# Patient Record
Sex: Male | Born: 1970
Health system: Southern US, Community
[De-identification: ages and names within clinical notes are randomized; demographics above are authoritative.]

## PROBLEM LIST (undated history)

## (undated) DIAGNOSIS — E785 Hyperlipidemia, unspecified: Secondary | ICD-10-CM

## (undated) DIAGNOSIS — G5603 Carpal tunnel syndrome, bilateral upper limbs: Secondary | ICD-10-CM

## (undated) DIAGNOSIS — R7989 Other specified abnormal findings of blood chemistry: Secondary | ICD-10-CM

## (undated) DIAGNOSIS — J45909 Unspecified asthma, uncomplicated: Secondary | ICD-10-CM

## (undated) DIAGNOSIS — M069 Rheumatoid arthritis, unspecified: Secondary | ICD-10-CM

## (undated) DIAGNOSIS — R945 Abnormal results of liver function studies: Secondary | ICD-10-CM

## (undated) DIAGNOSIS — G4730 Sleep apnea, unspecified: Secondary | ICD-10-CM

## (undated) HISTORY — DX: Abnormal results of liver function studies: R94.5

## (undated) HISTORY — DX: Other specified abnormal findings of blood chemistry: R79.89

## (undated) HISTORY — DX: Hyperlipidemia, unspecified: E78.5

## (undated) HISTORY — PX: WISDOM TOOTH EXTRACTION: SHX21

## (undated) HISTORY — DX: Rheumatoid arthritis, unspecified: M06.9

---

## 2013-03-09 ENCOUNTER — Emergency Department
Admission: EM | Admit: 2013-03-09 | Discharge: 2013-03-09 | Disposition: A | Discharge status: Home / Self Care | Payer: BC Managed Care – PPO | Source: Home / Self Care | Attending: Family Medicine | Admitting: Family Medicine

## 2013-03-09 ENCOUNTER — Telehealth: Payer: Self-pay | Admitting: Family Medicine

## 2013-03-09 ENCOUNTER — Encounter: Payer: Self-pay | Admitting: *Deleted

## 2013-03-09 DIAGNOSIS — W57XXXA Bitten or stung by nonvenomous insect and other nonvenomous arthropods, initial encounter: Secondary | ICD-10-CM

## 2013-03-09 DIAGNOSIS — R21 Rash and other nonspecific skin eruption: Secondary | ICD-10-CM

## 2013-03-09 LAB — COMPREHENSIVE METABOLIC PANEL
ALT: 46 U/L (ref 0–53)
AST: 33 U/L (ref 0–37)
Albumin: 4.4 g/dL (ref 3.5–5.2)
Alkaline Phosphatase: 60 U/L (ref 39–117)
BUN: 10 mg/dL (ref 6–23)
CO2: 27 mEq/L (ref 19–32)
Calcium: 10 mg/dL (ref 8.4–10.5)
Chloride: 102 mEq/L (ref 96–112)
Creat: 0.9 mg/dL (ref 0.50–1.35)
Glucose, Bld: 80 mg/dL (ref 70–99)
Potassium: 4.6 mEq/L (ref 3.5–5.3)
Sodium: 140 mEq/L (ref 135–145)
Total Bilirubin: 0.7 mg/dL (ref 0.3–1.2)
Total Protein: 7.7 g/dL (ref 6.0–8.3)

## 2013-03-09 LAB — POCT CBC W AUTO DIFF (K'VILLE URGENT CARE)

## 2013-03-09 LAB — SEDIMENTATION RATE: Sed Rate: 10 mm/hr (ref 0–16)

## 2013-03-09 MED ORDER — PREDNISONE 50 MG PO TABS: ORAL_TABLET | ORAL | Status: DC

## 2013-03-09 NOTE — ED Notes (Signed)
Patient c/o pain and rash on hip and back x 2 days; states he went through a spider web on Thursday night; than started to develop rash. Has tried OTC Benadryl and Motrin with no relief.

## 2013-03-09 NOTE — ED Provider Notes (Signed)
CSN: [id number]     Arrival date & time 03/09/13  1219 History   First MD Initiated Contact with Patient 03/09/13 1232     Chief Complaint  Patient presents with  . Rash    HPI  HPI  This patient complains of a RASH  Location: L lateral lower side   Onset: 2-3 days   Course: Pt states that he was outside at midnight and walked through a very large spider web. Spider web covered entire upper torso. No known spider bite at the time. Pt took a shower. Woke up the next morning with lateral side pain and mild rash. Pain was intermittent. Woke up next am. Had mild back intermittent back pain and rash. No fevers, chills. No known tick exposures. No burning or tingling. Self-treated with: nothing   Improvement with treatment: n/a  History  Itching: no  Tenderness: mild  New medications/antibiotics: no  Pet exposure: no  Recent travel or tropical exposure: no  New soaps, shampoos, detergent, clothing: no  Tick/insect exposure: yes; large spider web  Chemical Exposure: no  Red Flags  Feeling ill: no  Fever: no  Facial/tongue swelling/difficulty breathing: no  Diabetic or immunocompromised: no    History reviewed. No pertinent past medical history. History reviewed. No pertinent past surgical history. History reviewed. No pertinent family history. History  Substance Use Topics  . Smoking status: Never Smoker   . Smokeless tobacco: Not on file  . Alcohol Use: No    Review of Systems  All other systems reviewed and are negative.    Allergies  Review of patient's allergies indicates not on file.  Home Medications  No current outpatient prescriptions on file. BP 125/88  Pulse 74  Temp(Src) 98 F (36.7 C) (Oral)  Resp 16  Ht 6' (1.829 m)  Wt 215 lb 8 oz (97.75 kg)  BMI 29.22 kg/m2  SpO2 98% Physical Exam  Constitutional: He appears well-developed and well-nourished.  HENT:  Head: Normocephalic and atraumatic.  Eyes: Conjunctivae are normal. Pupils are equal, round,  and reactive to light.  Neck: Normal range of motion.  Cardiovascular: Normal rate and regular rhythm.   Pulmonary/Chest: Effort normal and breath sounds normal.  Abdominal: Soft.  Musculoskeletal: Normal range of motion.  Neurological: He is alert.  Skin: Rash noted.     ED Course  Procedures (including critical care time) Labs Review Labs Reviewed  ROCKY MTN SPOTTED FVR AB, IGG-BLOOD  ROCKY MTN SPOTTED FVR AB, IGM-BLOOD  B. BURGDORFI ANTIBODIES  SEDIMENTATION RATE  COMPREHENSIVE METABOLIC PANEL  POCT CBC W AUTO DIFF ([facility])   Imaging Review No results found.  MDM   1. Rash and nonspecific skin eruption   2. Insect bite    DDx for rash is fairly broad include insect (spider) bite, zoster, hsv, RMSF. No red flags on exam.Had a relatively lengthy discussion with pt. Overall well appearing. Would like watchful waiting approach. Will obtain baseline labs including RMSF titiers, lyme, HSV, VZV, CMET. Will give rx for prednisone if sxs worsen. Discussed derm red flags including fever, worsening pain.  Follow up as needed.     The patient and/or caregiver has been counseled thoroughly with regard to treatment plan and/or medications prescribed including dosage, schedule, interactions, rationale for use, and possible side effects and they verbalize understanding. Diagnoses and expected course of recovery discussed and will return if not improved as expected or if the condition worsens. Patient and/or caregiver verbalized understanding.  Doree Albee, MD 03/09/13 1327

## 2013-03-11 LAB — HSV(HERPES SIMPLEX VRS) I + II AB-IGG
HSV 1 Glycoprotein G Ab, IgG: 7.5 IV — ABNORMAL HIGH
HSV 2 Glycoprotein G Ab, IgG: <0.1 IV

## 2013-03-11 LAB — ROCKY MTN SPOTTED FVR AB, IGM-BLOOD: ROCKY MTN SPOTTED FEVER, IGM: 0.11 IV

## 2013-03-11 LAB — B. BURGDORFI ANTIBODIES: B burgdorferi Ab IgG+IgM: 0.33 {ISR}

## 2013-03-11 LAB — ROCKY MTN SPOTTED FVR AB, IGG-BLOOD: RMSF IgG: 0.2 IV

## 2013-03-11 LAB — HSV(HERPES SIMPLEX VRS) I + II AB-IGM: Herpes Simplex Vrs I&II-IgM Ab (EIA): 0.52 INDEX

## 2013-03-12 LAB — VARICELLA ZOSTER ANTIBODY, IGG: Varicella IgG: 1097 Index — ABNORMAL HIGH (ref <135.00)

## 2013-03-12 LAB — VARICELLA ZOSTER ANTIBODY, IGM: Varicella Zoster Ab IgM: 0.04 {ISR} (ref <0.91)

## 2013-03-13 ENCOUNTER — Telehealth: Payer: Self-pay | Admitting: *Deleted

## 2013-03-14 ENCOUNTER — Emergency Department: Admission: EM | Admit: 2013-03-14 | Discharge: 2013-03-14 | Discharge status: Absent without leave | Payer: Self-pay

## 2014-04-27 ENCOUNTER — Emergency Department
Admission: EM | Admit: 2014-04-27 | Discharge: 2014-04-27 | Disposition: A | Discharge status: Home / Self Care | Payer: 59 | Source: Home / Self Care | Attending: Emergency Medicine | Admitting: Emergency Medicine

## 2014-04-27 ENCOUNTER — Encounter: Payer: Self-pay | Admitting: Emergency Medicine

## 2014-04-27 DIAGNOSIS — J0100 Acute maxillary sinusitis, unspecified: Secondary | ICD-10-CM | POA: Diagnosis not present

## 2014-04-27 DIAGNOSIS — H65112 Acute and subacute allergic otitis media (mucoid) (sanguinous) (serous), left ear: Secondary | ICD-10-CM | POA: Diagnosis not present

## 2014-04-27 MED ORDER — AZITHROMYCIN 250 MG PO TABS
ORAL_TABLET | ORAL | Status: DC
Start: 2014-04-27 — End: 2014-06-18

## 2014-04-27 NOTE — ED Notes (Signed)
Reports one week of congestion, sinus discomfort, ear pain primarily in left, fatigue, cough, general aches; denies fever. No OTCs except Sudafed today.

## 2014-04-27 NOTE — ED Provider Notes (Signed)
CSN: [id number]     Arrival date & time 04/27/14  1424 History   First MD Initiated Contact with Patient 04/27/14 1443     Chief Complaint  Patient presents with  . Facial Pain  . Otalgia  . Nasal Congestion  . Cough  . Sore Throat  . Fatigue    HPI Reports one week of nasal and sinuscongestion, sinus discomfort,discolored rhinorrhea, L ear pain , fatigue, occasional nonproductive cough withgeneral aches; No OTCs except Sudafed today.that didn't seem to help significantly. No chest pain or shortness of breath. No nausea or vomiting. Has had mild fever and chills. Occasional myalgias. Minimal sore throat History reviewed. No pertinent past medical history. History reviewed. No pertinent past surgical history. Family History  Problem Relation Age of Onset  . Hypertension Mother   . Hypertension Father   . Hypertension Sister   . Hypertension Brother    History  Substance Use Topics  . Smoking status: Never Smoker   . Smokeless tobacco: Not on file  . Alcohol Use: No    Review of Systems  All other systems reviewed and are negative.   Allergies  Doxycycline  Home Medications   Prior to Admission medications   Medication Sig Start Date End Date Taking? Authorizing Provider  phenylephrine (SUDAFED PE) 10 MG TABS tablet Take 10 mg by mouth every 6 (six) hours as needed.   Yes Historical Provider, MD  azithromycin (ZITHROMAX Z-PAK) 250 MG tablet Take 2 tablets on day one, then 1 tablet daily on days 2 through 5 04/27/14   Jacqulyn Cane, MD  predniSONE (DELTASONE) 50 MG tablet 1 tab daily x 5 days 03/09/13   Shanda Howells, MD   BP 129/84 mmHg  Pulse 80  Temp(Src) 98 F (36.7 C) (Oral)  Resp 16  Ht 6' (1.829 m)  Wt 205 lb (92.987 kg)  BMI 27.80 kg/m2  SpO2 97% Physical Exam  Constitutional: He is oriented to person, place, and time. He appears well-developed and well-nourished. No distress.  HENT:  Head: Normocephalic and atraumatic.  Right Ear: Tympanic membrane,  external ear and ear canal normal. No drainage, swelling or tenderness. No mastoid tenderness. No decreased hearing is noted.  Left Ear: External ear and ear canal normal. No drainage, swelling or tenderness. No mastoid tenderness. Tympanic membrane is erythematous. Tympanic membrane is not perforated, not retracted and not bulging. A middle ear effusion (mild) is present. No decreased hearing is noted.  Nose: Mucosal edema and rhinorrhea present. Right sinus exhibits maxillary sinus tenderness. Left sinus exhibits maxillary sinus tenderness.  Mouth/Throat: Oropharynx is clear and moist. No oral lesions. No oropharyngeal exudate.  Eyes: Right eye exhibits no discharge. Left eye exhibits no discharge. No scleral icterus.  Neck: Neck supple.  Cardiovascular: Normal rate, regular rhythm and normal heart sounds.   Pulmonary/Chest: Effort normal and breath sounds normal. He has no wheezes. He has no rales.  Lymphadenopathy:    He has no cervical adenopathy.  Neurological: He is alert and oriented to person, place, and time.  Skin: Skin is warm and dry.  Nursing note and vitals reviewed.   ED Course  Procedures (including critical care time) Labs Review Labs Reviewed - No data to display  Imaging Review No results found.   MDM   1. Acute maxillary sinusitis, recurrence not specified   2. Acute mucoid otitis media of left ear    Treatment options discussed, as well as risks, benefits, alternatives. Antibiotic options discussed, such as Augmentin or a cephalosporin,  but he states he prefers Zithromax as he has done well with that in the past. Patient voiced understanding and agreement with the following plans:  Discharge Medication List as of 04/27/2014  3:56 PM    START taking these medications   Details  azithromycin (ZITHROMAX Z-PAK) 250 MG tablet Take 2 tablets on day one, then 1 tablet daily on days 2 through 5, Normal       Continue Flonase,decongestant, and other symptomatic  care. Over 25 minutes spent, greater than 50% of the time spent for counseling and coordination of care.--questions invited and answered. Follow-up with your primary care doctor in 5-7 days if not improving, or sooner if symptoms become worse. Precautions discussed. Red flags discussed. Questions invited and answered. Patient voiced understanding and agreement.    Jacqulyn Cane, MD 04/27/14 2004

## 2014-06-18 ENCOUNTER — Emergency Department
Admission: EM | Admit: 2014-06-18 | Discharge: 2014-06-18 | Disposition: A | Discharge status: Home / Self Care | Payer: 59 | Source: Home / Self Care | Attending: Family Medicine | Admitting: Family Medicine

## 2014-06-18 ENCOUNTER — Encounter: Payer: Self-pay | Admitting: *Deleted

## 2014-06-18 DIAGNOSIS — J029 Acute pharyngitis, unspecified: Secondary | ICD-10-CM

## 2014-06-18 LAB — POCT RAPID STREP A (OFFICE): Rapid Strep A Screen: NEGATIVE

## 2014-06-18 MED ORDER — AZITHROMYCIN 250 MG PO TABS: ORAL_TABLET | ORAL | Status: DC

## 2014-06-18 MED ORDER — BENZONATATE 200 MG PO CAPS: 200.0000 mg | ORAL_CAPSULE | Freq: Every day | ORAL | Status: DC

## 2014-06-18 NOTE — ED Provider Notes (Signed)
CSN: [id number]     Arrival date & time 06/18/14  0102 History   First MD Initiated Contact with Patient 06/18/14 1017     Chief Complaint  Patient presents with  . Sore Throat      HPI Comments: Patient complains of three day history of cold-like symptoms including mild sore throat, hoarseness,  fatigue, and cough.  He denies fever and sinus congestion.  The history is provided by the patient.    History reviewed. No pertinent past medical history. History reviewed. No pertinent past surgical history. Family History  Problem Relation Age of Onset  . Hypertension Mother   . Hypertension Father   . Hypertension Sister   . Hypertension Brother    History  Substance Use Topics  . Smoking status: Never Smoker   . Smokeless tobacco: Never Used  . Alcohol Use: No    Review of Systems + sore throat + hoarse + cough No pleuritic pain No wheezing + nasal congestion + post-nasal drainage No sinus pain/pressure No itchy/red eyes No earache No hemoptysis No SOB No fever/chills No nausea No vomiting No abdominal pain No diarrhea No urinary symptoms No skin rash + fatigue No myalgias No headache Used OTC meds without relief  Allergies  Doxycycline  Home Medications   Prior to Admission medications   Medication Sig Start Date End Date Taking? Authorizing Provider  benzonatate (TESSALON) 200 MG capsule Take 1 capsule (200 mg total) by mouth at bedtime. Take as needed for cough 06/18/14   Kandra Nicolas, MD   BP 144/97 mmHg  Pulse 73  Temp(Src) 97.9 F (36.6 C) (Oral)  Resp 14  Wt 216 lb (97.977 kg)  SpO2 98% Physical Exam Nursing notes and Vital Signs reviewed. Appearance:  Patient appears stated age, and in no acute distress Eyes:  Pupils are equal, round, and reactive to light and accomodation.  Extraocular movement is intact.  Conjunctivae are not inflamed  Ears:  Canals normal.  Tympanic membranes normal.  Nose:  Mildly congested turbinates.  No sinus  tenderness.   Pharynx:  Mild erythema of posterior pharynx and uvula. Neck:  Supple.  Tender enlarged posterior nodes are palpated bilaterally  Lungs:  Clear to auscultation.  Breath sounds are equal.  Heart:  Regular rate and rhythm without murmurs, rubs, or gallops.  Abdomen:  Nontender without masses or hepatosplenomegaly.  Bowel sounds are present.  No CVA or flank tenderness.  Extremities:  No edema.  No calf tenderness Skin:  No rash present.   ED Course  Procedures    None  Labs Reviewed  STREP A DNA PROBE  POCT RAPID STREP A (OFFICE) negative      MDM   1. Acute pharyngitis, unspecified pharyngitis type; suspect early viral URI    There is no evidence of bacterial infection today.  Throat culture pending. Prescription written for Benzonatate [facility]) to take at bedtime for night-time cough.   If cold-like symptoms develop, begin the following: Take plain Mucinex (1200 mg guaifenesin) twice daily for cough and congestion.  May add Sudafed for sinus congestion.   Increase fluid intake, rest. May use Afrin nasal spray (or generic oxymetazoline) twice daily for about 5 days.  Also recommend using saline nasal spray several times daily and saline nasal irrigation (AYR is a common brand) Try warm salt water gargles for sore throat.  Stop all antihistamines for now, and other non-prescription cough/cold preparations. May take Ibuprofen 200mg , 4 tabs every 8 hours with food for sore throat.  Begin Tessalon for night cough. Begin Azithromycin if not improving about one week or if persistent fever develops (Given a prescription to hold, with an expiration date)  Follow-up with family doctor if not improving about10 days.     Kandra Nicolas, MD 06/20/14 [phone]

## 2014-06-18 NOTE — Discharge Instructions (Signed)
If cold-like symptoms develop, begin the following: Take plain Mucinex (1200 mg guaifenesin) twice daily for cough and congestion.  May add Sudafed for sinus congestion.   Increase fluid intake, rest. May use Afrin nasal spray (or generic oxymetazoline) twice daily for about 5 days.  Also recommend using saline nasal spray several times daily and saline nasal irrigation (AYR is a common brand) Try warm salt water gargles for sore throat.  Stop all antihistamines for now, and other non-prescription cough/cold preparations. May take Ibuprofen 200mg , 4 tabs every 8 hours with food for sore throat. Begin Tessalon for night cough. Begin Azithromycin if not improving about one week or if persistent fever develops  Follow-up with family doctor if not improving about10 days.

## 2014-06-18 NOTE — ED Notes (Signed)
Andre Cohen c/o sore throat x 3 days. Several days ago he tried swallowing while sneezing and was left feeling "punched in the throat". Throat was sore for several days but resolved. Returned 3 days ago.

## 2014-06-19 LAB — STREP A DNA PROBE: GASP: NEGATIVE

## 2014-08-18 ENCOUNTER — Ambulatory Visit: Payer: 59 | Admitting: Sports Medicine

## 2014-08-20 ENCOUNTER — Encounter: Payer: Self-pay | Admitting: Sports Medicine

## 2014-08-20 ENCOUNTER — Ambulatory Visit (INDEPENDENT_AMBULATORY_CARE_PROVIDER_SITE_OTHER): Payer: 59 | Admitting: Sports Medicine

## 2014-08-20 ENCOUNTER — Ambulatory Visit (INDEPENDENT_AMBULATORY_CARE_PROVIDER_SITE_OTHER): Payer: 59

## 2014-08-20 VITALS — BP 130/93 | HR 82 | Ht 72.0 in | Wt 221.0 lb

## 2014-08-20 DIAGNOSIS — R7303 Prediabetes: Secondary | ICD-10-CM

## 2014-08-20 DIAGNOSIS — K7581 Nonalcoholic steatohepatitis (NASH): Secondary | ICD-10-CM

## 2014-08-20 DIAGNOSIS — E875 Hyperkalemia: Secondary | ICD-10-CM

## 2014-08-20 DIAGNOSIS — B001 Herpesviral vesicular dermatitis: Secondary | ICD-10-CM

## 2014-08-20 DIAGNOSIS — R7309 Other abnormal glucose: Secondary | ICD-10-CM

## 2014-08-20 DIAGNOSIS — E785 Hyperlipidemia, unspecified: Secondary | ICD-10-CM

## 2014-08-20 DIAGNOSIS — Z0000 Encounter for general adult medical examination without abnormal findings: Secondary | ICD-10-CM

## 2014-08-20 LAB — CBC
HCT: 45 % (ref 39.0–52.0)
Hemoglobin: 15.5 g/dL (ref 13.0–17.0)
MCH: 30.1 pg (ref 26.0–34.0)
MCHC: 34.4 g/dL (ref 30.0–36.0)
MCV: 87.4 fL (ref 78.0–100.0)
MPV: 11.1 fL (ref 8.6–12.4)
Platelets: 354 10*3/uL (ref 150–400)
RBC: 5.15 MIL/uL (ref 4.22–5.81)
RDW: 13.6 % (ref 11.5–15.5)
WBC: 6.3 10*3/uL (ref 4.0–10.5)

## 2014-08-20 LAB — TSH: TSH: 1.511 u[IU]/mL (ref 0.350–4.500)

## 2014-08-20 LAB — HEMOGLOBIN A1C
Hgb A1c MFr Bld: 6 % — ABNORMAL HIGH (ref <5.7)
Mean Plasma Glucose: 126 mg/dL — ABNORMAL HIGH (ref <117)

## 2014-08-20 MED ORDER — VALACYCLOVIR HCL 1 G PO TABS: 1000.0000 mg | ORAL_TABLET | Freq: Two times a day (BID) | ORAL | Status: DC

## 2014-08-20 NOTE — Assessment & Plan Note (Signed)
Healthy male, checking routine blood work.

## 2014-08-20 NOTE — Assessment & Plan Note (Signed)
Valtrex for use as needed

## 2014-08-20 NOTE — Progress Notes (Signed)
  Subjective:    CC: Establish care.   HPI:  This is a very pleasant healthy 44 year old male, he gets occasional cold sores and would like some Valtrex.  He also has a history of fatty liver with mildly elevated liver function tests, this has been evaluated and worked up by hepatology and he agrees to simply do yearly liver function and ultrasounds.  Past medical history, Surgical history, Family history not pertinant except as noted below, Social history, Allergies, and medications have been entered into the medical record, reviewed, and no changes needed.   Review of Systems: No headache, visual changes, nausea, vomiting, diarrhea, constipation, dizziness, abdominal pain, skin rash, fevers, chills, night sweats, swollen lymph nodes, weight loss, chest pain, body aches, joint swelling, muscle aches, shortness of breath, mood changes, visual or auditory hallucinations.  Objective:    General: Well Developed, well nourished, and in no acute distress.  Neuro: Alert and oriented x3, extra-ocular muscles intact, sensation grossly intact. Cranial nerves II through XII are intact, motor, sensory, and coordinative functions are all intact. HEENT: Normocephalic, atraumatic, pupils equal round reactive to light, neck supple, no masses, no lymphadenopathy, thyroid nonpalpable. Oropharynx, nasopharynx, external ear canals are unremarkable. Skin: Warm and dry, no rashes noted.  Cardiac: Regular rate and rhythm, no murmurs rubs or gallops.  Respiratory: Clear to auscultation bilaterally. Not using accessory muscles, speaking in full sentences.  Abdominal: Soft, nontender, nondistended, positive bowel sounds, no masses, no organomegaly.  Musculoskeletal: Shoulder, elbow, wrist, hip, knee, ankle stable, and with full range of motion.  Impression and Recommendations:    The patient was counselled, risk factors were discussed, anticipatory guidance given.

## 2014-08-20 NOTE — Assessment & Plan Note (Signed)
This is been worked up by hepatology. We will simply screen him with yearly liver function and an ultrasound.

## 2014-08-21 DIAGNOSIS — R7303 Prediabetes: Secondary | ICD-10-CM | POA: Insufficient documentation

## 2014-08-21 DIAGNOSIS — E785 Hyperlipidemia, unspecified: Secondary | ICD-10-CM | POA: Insufficient documentation

## 2014-08-21 DIAGNOSIS — E875 Hyperkalemia: Secondary | ICD-10-CM | POA: Insufficient documentation

## 2014-08-21 LAB — COMPREHENSIVE METABOLIC PANEL
ALT: 49 U/L (ref 0–53)
AST: 37 U/L (ref 0–37)
Albumin: 4.6 g/dL (ref 3.5–5.2)
Alkaline Phosphatase: 53 U/L (ref 39–117)
BUN: 16 mg/dL (ref 6–23)
CO2: 23 mEq/L (ref 19–32)
Calcium: 10.1 mg/dL (ref 8.4–10.5)
Chloride: 103 mEq/L (ref 96–112)
Creat: 0.92 mg/dL (ref 0.50–1.35)
Glucose, Bld: 90 mg/dL (ref 70–99)
Potassium: 5.5 mEq/L — ABNORMAL HIGH (ref 3.5–5.3)
Sodium: 140 mEq/L (ref 135–145)
Total Bilirubin: 0.5 mg/dL (ref 0.2–1.2)
Total Protein: 7.5 g/dL (ref 6.0–8.3)

## 2014-08-21 LAB — LIPID PANEL
Cholesterol: 208 mg/dL — ABNORMAL HIGH (ref 0–200)
HDL: 43 mg/dL (ref >=40)
LDL Cholesterol: 148 mg/dL — ABNORMAL HIGH (ref 0–99)
Total CHOL/HDL Ratio: 4.8 Ratio
Triglycerides: 87 mg/dL (ref <150)
VLDL: 17 mg/dL (ref 0–40)

## 2014-08-21 LAB — VITAMIN D 25 HYDROXY (VIT D DEFICIENCY, FRACTURES): Vit D, 25-Hydroxy: 16 ng/mL — ABNORMAL LOW (ref 30–100)

## 2014-08-21 MED ORDER — VITAMIN D (ERGOCALCIFEROL) 1.25 MG (50000 UNIT) PO CAPS: 50000.0000 [IU] | ORAL_CAPSULE | ORAL | Status: DC

## 2014-08-21 NOTE — Addendum Note (Signed)
Addended by: Silverio Decamp on: 08/21/2014 10:34 AM   Modules accepted: Orders

## 2014-08-21 NOTE — Assessment & Plan Note (Signed)
Will work on weight loss and dietary modification before consideration of lipid medication, we can recheck this in 3 months.

## 2014-08-21 NOTE — Assessment & Plan Note (Signed)
Mild, no visible hemolysis in the specimen. Recheck in a week.

## 2014-08-27 ENCOUNTER — Telehealth: Payer: Self-pay

## 2014-08-27 DIAGNOSIS — B001 Herpesviral vesicular dermatitis: Secondary | ICD-10-CM

## 2014-08-27 MED ORDER — VALACYCLOVIR HCL 1 G PO TABS: 1000.0000 mg | ORAL_TABLET | Freq: Two times a day (BID) | ORAL | Status: DC

## 2014-08-27 MED ORDER — VITAMIN D (ERGOCALCIFEROL) 1.25 MG (50000 UNIT) PO CAPS: 50000.0000 [IU] | ORAL_CAPSULE | ORAL | Status: DC

## 2014-08-27 NOTE — Telephone Encounter (Signed)
Medications were sent to wrong pharmacy, cancelled the other medications and sent new Rx to [facility]. Kinslie Hove,CMA

## 2014-10-10 LAB — BASIC METABOLIC PANEL
BUN: 16 mg/dL (ref 6–23)
CO2: 25 mEq/L (ref 19–32)
Calcium: 9.9 mg/dL (ref 8.4–10.5)
Chloride: 104 mEq/L (ref 96–112)
Creat: 0.92 mg/dL (ref 0.50–1.35)
Glucose, Bld: 91 mg/dL (ref 70–99)
Potassium: 5.1 mEq/L (ref 3.5–5.3)
Sodium: 140 mEq/L (ref 135–145)

## 2014-11-20 ENCOUNTER — Other Ambulatory Visit: Payer: Self-pay | Admitting: Sports Medicine

## 2014-12-12 ENCOUNTER — Ambulatory Visit: Payer: 59 | Admitting: Sports Medicine

## 2015-01-23 ENCOUNTER — Ambulatory Visit: Payer: 59 | Admitting: Sports Medicine

## 2015-03-17 ENCOUNTER — Ambulatory Visit (INDEPENDENT_AMBULATORY_CARE_PROVIDER_SITE_OTHER): Payer: 59 | Admitting: Sports Medicine

## 2015-03-17 ENCOUNTER — Encounter: Payer: Self-pay | Admitting: Sports Medicine

## 2015-03-17 VITALS — BP 115/80 | HR 84 | Wt 208.0 lb

## 2015-03-17 DIAGNOSIS — G4730 Sleep apnea, unspecified: Secondary | ICD-10-CM | POA: Diagnosis not present

## 2015-03-17 NOTE — Assessment & Plan Note (Signed)
Checking sleep study. Excessive daytime sleepiness, witnessed apnea at night.

## 2015-03-17 NOTE — Progress Notes (Signed)
  Subjective:    CC: Follow-up  HPI: This is a pleasant 44 year old male, he is here to follow-up his blood work and discuss sleep apnea. His wife notices episodes of gasping, apnea. He never wakes up well rested.  Past medical history, Surgical history, Family history not pertinant except as noted below, Social history, Allergies, and medications have been entered into the medical record, reviewed, and no changes needed.   Review of Systems: No fevers, chills, night sweats, weight loss, chest pain, or shortness of breath.   Objective:    General: Well Developed, well nourished, and in no acute distress.  Neuro: Alert and oriented x3, extra-ocular muscles intact, sensation grossly intact.  HEENT: Normocephalic, atraumatic, pupils equal round reactive to light, neck supple, no masses, no lymphadenopathy, thyroid nonpalpable.  Skin: Warm and dry, no rashes. Cardiac: Regular rate and rhythm, no murmurs rubs or gallops, no lower extremity edema.  Respiratory: Clear to auscultation bilaterally. Not using accessory muscles, speaking in full sentences.  Impression and Recommendations:   I spent 25 minutes with this patient, greater than 50% was face-to-face time counseling regarding the above diagnoses

## 2015-03-31 ENCOUNTER — Encounter: Payer: Self-pay | Admitting: Osteopathic Medicine

## 2015-03-31 ENCOUNTER — Ambulatory Visit (INDEPENDENT_AMBULATORY_CARE_PROVIDER_SITE_OTHER): Payer: 59 | Admitting: Osteopathic Medicine

## 2015-03-31 VITALS — BP 129/87 | HR 76 | Temp 98.5°F | Ht 72.0 in | Wt 207.0 lb

## 2015-03-31 DIAGNOSIS — J069 Acute upper respiratory infection, unspecified: Secondary | ICD-10-CM

## 2015-03-31 DIAGNOSIS — R05 Cough: Secondary | ICD-10-CM

## 2015-03-31 DIAGNOSIS — J209 Acute bronchitis, unspecified: Secondary | ICD-10-CM

## 2015-03-31 DIAGNOSIS — B9789 Other viral agents as the cause of diseases classified elsewhere: Secondary | ICD-10-CM

## 2015-03-31 DIAGNOSIS — R059 Cough, unspecified: Secondary | ICD-10-CM

## 2015-03-31 MED ORDER — GUAIFENESIN-CODEINE 100-10 MG/5ML PO SYRP: 5.0000 mL | ORAL_SOLUTION | Freq: Three times a day (TID) | ORAL | Status: DC | PRN

## 2015-03-31 MED ORDER — BENZONATATE 200 MG PO CAPS: 200.0000 mg | ORAL_CAPSULE | Freq: Three times a day (TID) | ORAL | Status: DC | PRN

## 2015-03-31 MED ORDER — AZITHROMYCIN 250 MG PO TABS: ORAL_TABLET | ORAL | Status: DC

## 2015-03-31 MED ORDER — ALBUTEROL SULFATE HFA 108 (90 BASE) MCG/ACT IN AERS: 2.0000 | INHALATION_SPRAY | Freq: Four times a day (QID) | RESPIRATORY_TRACT | Status: AC | PRN

## 2015-03-31 NOTE — Progress Notes (Signed)
HPI: Andre Cohen is a 44 y.o. male who presents to [place]  today for chief complaint of:  Chief Complaint  Patient presents with  . Cough    tightness in chest   . Location: chest . Quality: congestion . Severity: mild to moderate . Duration: 9 DAYS . Modifying factors: tried flonase and old albuterol inhaler from few years ago. Sudafed, Ibuprofen, Mucinex . Assoc signs/symptoms: some productive cough   Past medical, social and family history reviewed: Past Medical History  Diagnosis Date  . LFT elevation     VITAMIN USE IN THE PAST   No past surgical history on file. Social History  Substance Use Topics  . Smoking status: Never Smoker   . Smokeless tobacco: Never Used  . Alcohol Use: No   Family History  Problem Relation Age of Onset  . Hypertension Mother   . Hypertension Father   . Hypertension Sister   . Hypertension Brother   . Cancer Brother     skin    Current Outpatient Prescriptions  Medication Sig Dispense Refill  . azithromycin (ZITHROMAX) 250 MG tablet 2 tabs po x1 on Day 1, then 1 tab po daily on Days 2 - 5 6 tablet 0  . benzonatate (TESSALON) 200 MG capsule Take 1 capsule (200 mg total) by mouth 3 (three) times daily as needed for cough. 60 capsule 0  . guaiFENesin-codeine (ROBITUSSIN AC) 100-10 MG/5ML syrup Take 5 mLs by mouth 3 (three) times daily as needed for cough. 118 mL 0   No current facility-administered medications for this visit.   Allergies  Allergen Reactions  . Doxycycline       Review of Systems: CONSTITUTIONAL: (+) subjective fever/chills, no unintentional weight changes HEAD/EYES/EARS/NOSE/THROAT: No headache/vision change or hearing change, no sore throat, no sinus pressure CARDIAC: No chest pain/pressure/palpitations, (+) chest congestion as per HPI RESPIRATORY: (+) cough, no shortness of breath/wheeze GASTROINTESTINAL: No nausea/vomiting/abdominal pain/blood in  stool/diarrhea/constipation MUSCULOSKELETAL: (+) mild myalgia/arthralgia SKIN: No rash/wounds/concerning lesions HEM/ONC: No abnormal lymph node   Exam:  BP 129/87 mmHg  Pulse 76  Temp(Src) 98.5 F (36.9 C) (Oral)  Ht 6' (1.829 m)  Wt 207 lb (93.895 kg)  BMI 28.07 kg/m2  SpO2 100% Constitutional: VSS, see above. General Appearance: alert, well-developed, well-nourished, NAD Eyes: Normal lids and conjunctive, non-icteric sclera, PERRLA Ears, Nose, Mouth, Throat: Normal external inspection ears/nares/mouth/lips/gums, Normal TM bilaterally, MMM, posterior pharynx without erythema/exudate Neck: No masses, trachea midline. No thyroid enlargement/tenderness/mass appreciated Respiratory: Normal respiratory effort. no wheeze/rhonchi/rales Cardiovascular: S1/S2 normal, no murmur/rub/gallop auscultated. RRR.  Musculoskeletal: Gait normal. No clubbing/cyanosis of digits.    No results found for this or any previous visit (from the past 72 hour(s)).    ASSESSMENT/PLAN:  Cough - Plan: guaiFENesin-codeine (ROBITUSSIN AC) 100-10 MG/5ML syrup, benzonatate (TESSALON) 200 MG capsule  Viral URI with cough  Acute bronchitis, unspecified organism - Plan: azithromycin (ZITHROMAX) 250 MG tablet, DISCONTINUED: azithromycin (ZITHROMAX) 250 MG tablet   Advised most likely viral, will refill albuterol, suspicious for underlying asthma patient has not been worked up for this in many years, consider coming back for pulmonary function test. Printed prescription given for Z-Pak, patient advised fill the rx if he is not feeling better by the weekend.

## 2015-03-31 NOTE — Patient Instructions (Signed)
FILL ANTIBIOTICS AND TAKE THESE IF YOU'RE NO BETTER BY THIS WEEKEND RECOMMEND CODEINE COUGH SYRUP AT NIGHT, BENZONATATE PILLS IN DAY HAND WASHING AND COVER COUGH TO PREVENT SPREAD OF GERMS!  RETURN TO CLINIC FOR FURTHER EVALUATION IF YOU ARE NOT BETTER IN 1 WEEK.

## 2015-04-23 ENCOUNTER — Ambulatory Visit (HOSPITAL_BASED_OUTPATIENT_CLINIC_OR_DEPARTMENT_OTHER): Payer: 59 | Attending: Sports Medicine

## 2015-04-23 VITALS — Ht 72.0 in | Wt 208.0 lb

## 2015-04-23 DIAGNOSIS — G4733 Obstructive sleep apnea (adult) (pediatric): Secondary | ICD-10-CM | POA: Insufficient documentation

## 2015-04-23 DIAGNOSIS — R5383 Other fatigue: Secondary | ICD-10-CM | POA: Insufficient documentation

## 2015-04-23 DIAGNOSIS — G4730 Sleep apnea, unspecified: Secondary | ICD-10-CM

## 2015-04-23 DIAGNOSIS — R0683 Snoring: Secondary | ICD-10-CM | POA: Diagnosis not present

## 2015-04-23 DIAGNOSIS — G4719 Other hypersomnia: Secondary | ICD-10-CM | POA: Insufficient documentation

## 2015-04-25 DIAGNOSIS — G4730 Sleep apnea, unspecified: Secondary | ICD-10-CM | POA: Diagnosis not present

## 2015-04-25 NOTE — Progress Notes (Signed)
Patient Name: Andre Cohen, Andre Cohen Date: 04/23/2015 Gender: Male D.O.B: 1970-08-08 Age (years): 44 Referring Provider: Gwen Her Thekkekandam Height (inches): 31 Interpreting Physician: Baird Lyons MD, ABSM Weight (lbs): 208 RPSGT: Baxter Flattery BMI: 30 MRN: [id number] Neck Size: 16.50 CLINICAL INFORMATION Sleep Study Type: Split Night CPAP Indication for sleep study: Excessive Daytime Sleepiness, Fatigue, OSA, Snoring, Witnessed Apneas Epworth Sleepiness Score: 3  SLEEP STUDY TECHNIQUE As per the AASM Manual for the Scoring of Sleep and Associated Events v2.3 (April 2016) with a hypopnea requiring 4% desaturations. The channels recorded and monitored were frontal, central and occipital EEG, electrooculogram (EOG), submentalis EMG (chin), nasal and oral airflow, thoracic and abdominal wall motion, anterior tibialis EMG, snore microphone, electrocardiogram, and pulse oximetry. Continuous positive airway pressure (CPAP) was initiated when the patient met split night criteria and was titrated according to treat sleep-disordered breathing.  MEDICATIONS Medications taken by the patient : charted for review Medications administered by patient during sleep study : No sleep medicine administered.  RESPIRATORY PARAMETERS Diagnostic Total AHI (/hr): 25.3 RDI (/hr): 25.3 OA Index (/hr): 7.3 CA Index (/hr): 4.5 REM AHI (/hr): N/A NREM AHI (/hr): 25.3 Supine AHI (/hr): 25.3 Non-supine AHI (/hr): N/A Min O2 Sat (%): 87.00 Mean O2 (%): 93.07 Time below 88% (min): 1.6   Titration Optimal Pressure (cm): 12 AHI at Optimal Pressure (/hr): 0.0 Min O2 at Optimal Pressure (%): 93.0 Supine % at Optimal (%): 100 Sleep % at Optimal (%): 100    SLEEP ARCHITECTURE The recording time for the entire night was 372.1 minutes. During a baseline period of 187.8 minutes, the patient slept for 173.3 minutes in REM and nonREM, yielding a sleep efficiency of 92.2%. Sleep onset after lights out was 13.6 minutes  with a REM latency of N/A minutes. The patient spent 2.02% of the night in stage N1 sleep, 80.09% in stage N2 sleep, 17.89% in stage N3 and 0.00% in REM. During the titration period of 177.6 minutes, the patient slept for 164.0 minutes in REM and nonREM, yielding a sleep efficiency of 92.4%. Sleep onset after CPAP initiation was 6.5 minutes with a REM latency of 65.0 minutes. The patient spent 1.83% of the night in stage N1 sleep, 80.49% in stage N2 sleep, 0.00% in stage N3 and 17.68% in REM. CARDIAC DATA The 2 lead EKG demonstrated sinus rhythm. The mean heart rate was 60.15 beats per minute. Other EKG findings include: None.  LEG MOVEMENT DATA The total Periodic Limb Movements of Sleep (PLMS) were 0. The PLMS index was 0.00 .  IMPRESSIONS - Moderate obstructive sleep apnea occurred during the diagnostic portion of the study(AHI = 25.3/hour). An optimal PAP pressure was selected for this patient ( 12 cm of water) - Mild central sleep apnea occurred during the diagnostic portion of the study (CAI = 4.5/hour). - The patient had minimal or no oxygen desaturation during the diagnostic portion of the study (Min O2 = 87.00%) - The patient snored with Soft snoring volume during the diagnostic portion of the study. - No cardiac abnormalities were noted during this study. - Clinically significant periodic limb movements did not occur during sleep.  DIAGNOSIS - Obstructive Sleep Apnea (327.23 [G47.33 ICD-10])  RECOMMENDATIONS - Trial of CPAP therapy on 12 cm H2O with a Medium size Fisher&Paykel Full Face Mask Simplus mask and heated humidification. - Avoid alcohol, sedatives and other CNS depressants that may worsen sleep apnea and disrupt normal sleep architecture. - Sleep hygiene should be reviewed to assess factors that may improve sleep  quality. - Weight management and regular exercise should be initiated or continued.  Deneise Lever Diplomate, American Board of Sleep  Medicine  ELECTRONICALLY SIGNED ON:  04/25/2015, 1:07 PM [place] PH: [phone]   FX: [phone] [place]

## 2015-05-18 ENCOUNTER — Telehealth: Payer: Self-pay

## 2015-05-18 NOTE — Telephone Encounter (Signed)
Patient is calling for his sleep study results. Please advise.

## 2015-05-18 NOTE — Telephone Encounter (Signed)
It was positive and he does have sleep apnea, he should be on CPAP at 12cm H20 pressure.

## 2015-05-19 NOTE — Telephone Encounter (Signed)
Left message advising of results and for a return call.

## 2015-05-21 MED ORDER — AMBULATORY NON FORMULARY MEDICATION: Status: DC

## 2015-05-21 NOTE — Telephone Encounter (Signed)
Pt wife Jeral Fruit) called clinic, states Pt would like an order for CPAP machine. Will route.

## 2015-05-21 NOTE — Telephone Encounter (Signed)
Patient advised.

## 2015-05-21 NOTE — Telephone Encounter (Signed)
Rx printed out. Likely needs to be faxed to home health.

## 2015-05-22 ENCOUNTER — Ambulatory Visit (INDEPENDENT_AMBULATORY_CARE_PROVIDER_SITE_OTHER): Payer: 59 | Admitting: Sports Medicine

## 2015-05-22 ENCOUNTER — Ambulatory Visit (INDEPENDENT_AMBULATORY_CARE_PROVIDER_SITE_OTHER): Payer: 59

## 2015-05-22 ENCOUNTER — Encounter: Payer: Self-pay | Admitting: Sports Medicine

## 2015-05-22 VITALS — BP 135/94 | HR 75 | Wt 202.0 lb

## 2015-05-22 DIAGNOSIS — G4730 Sleep apnea, unspecified: Secondary | ICD-10-CM

## 2015-05-22 DIAGNOSIS — Z23 Encounter for immunization: Secondary | ICD-10-CM | POA: Diagnosis not present

## 2015-05-22 DIAGNOSIS — M25511 Pain in right shoulder: Secondary | ICD-10-CM

## 2015-05-22 MED ORDER — MELOXICAM 15 MG PO TABS: ORAL_TABLET | ORAL | Status: DC

## 2015-05-22 NOTE — Assessment & Plan Note (Signed)
Rotator cuff dysfunction, pain is worse when throwing baseball with his son. X-rays, meloxicam, formal physical therapy.

## 2015-05-22 NOTE — Progress Notes (Signed)
  Subjective:    CC: Follow-up  HPI: Sleep apnea: Felt much better after a night at the sleep lab on CPAP. Has not yet obtained his CPAP device.  Right shoulder pain: Worse with overhead activities, moderate, persistent, has difficulty throwing baseball with his son. No mechanical symptoms, no trauma. Does note that his left shoulder sits somewhat higher than the right.  Past medical history, Surgical history, Family history not pertinant except as noted below, Social history, Allergies, and medications have been entered into the medical record, reviewed, and no changes needed.   Review of Systems: No fevers, chills, night sweats, weight loss, chest pain, or shortness of breath.   Objective:    General: Well Developed, well nourished, and in no acute distress.  Neuro: Alert and oriented x3, extra-ocular muscles intact, sensation grossly intact.  HEENT: Normocephalic, atraumatic, pupils equal round reactive to light, neck supple, no masses, no lymphadenopathy, thyroid nonpalpable.  Skin: Warm and dry, no rashes. Cardiac: Regular rate and rhythm, no murmurs rubs or gallops, no lower extremity edema.  Respiratory: Clear to auscultation bilaterally. Not using accessory muscles, speaking in full sentences. Right Shoulder: Inspection reveals no abnormalities, atrophy or asymmetry. Palpation is normal with no tenderness over AC joint or bicipital groove. ROM is full in all planes. Rotator cuff strength normal throughout. Positive Neer and Hawkin's tests, empty can. Speeds and Yergason's tests normal. No labral pathology noted with negative Obrien's, negative crank, negative clunk, and good stability. Normal scapular function observed. No painful arc and no drop arm sign. No apprehension sign Right shoulder does slump lower, and on forward bending he does have a right rib hump suggesting scoliosis.  Impression and Recommendations:

## 2015-05-22 NOTE — Assessment & Plan Note (Signed)
Or has already been placed for CPAP at 15 cm H2O.

## 2015-05-22 NOTE — Telephone Encounter (Signed)
Information was given to representative for [facility].

## 2015-06-03 ENCOUNTER — Ambulatory Visit (INDEPENDENT_AMBULATORY_CARE_PROVIDER_SITE_OTHER): Payer: 59 | Admitting: Physical Therapy

## 2015-06-03 ENCOUNTER — Encounter: Payer: Self-pay | Admitting: Physical Therapy

## 2015-06-03 DIAGNOSIS — R29898 Other symptoms and signs involving the musculoskeletal system: Secondary | ICD-10-CM | POA: Diagnosis not present

## 2015-06-03 DIAGNOSIS — M25511 Pain in right shoulder: Secondary | ICD-10-CM

## 2015-06-03 NOTE — Patient Instructions (Addendum)
Strengthening: Resisted Internal Rotation   [place] [id number]   Hold tubing in left hand, elbow at side and forearm out. Rotate forearm in across body. Repeat __10__ times per set. Do __3__ sets per session. Do _1___ sessions per day.   Strengthening: Resisted External Rotation - [place] towel under elbow   Hold tubing in right hand, elbow at side and forearm across body. Rotate forearm out. Hold towel under elbow. Repeat _10___ times per set. Do __3__ sets per session. Do __1__ sessions per day.  Strengthening: Resisted Extension   Hold tubing in right hand, arm forward. Pull arm back, elbow straight, squeezing shoulder blades together.  Repeat _10___ times per set. Do __3__ sets per session. Do __1__ sessions per day.   Scapular: Stabilization (Prone)    Holding __2__ pound weights, raise both arms out from sides. Keep elbows straight. Repeat __10__ times per set. Do __3__ sets per session. Do __1__ sessions per day.   Resisted Horizontal Abduction: Bilateral    Sit or stand, tubing in both hands, arms out in front. Keeping arms straight, pinch shoulder blades together and stretch arms out. Repeat __10__ times per set. Do __2-3__ sets per session. Do __1__ sessions per day. Red band, progress to greens  Scapula Adduction With Pectorals, Low   Stand in doorframe with palms against frame and arms at 45. Lean forward and squeeze shoulder blades. Hold _30__ seconds. Repeat __2_ times per session. Do __2_ sessions per day.  Copyright  VHI. All rights reserved.    Scapula Adduction With Pectorals, Mid-Range   Stand in doorframe with palms against frame and arms at 90. Lean forward and squeeze shoulder blades. Hold __30_ seconds. Repeat __2_ times per session. Do _2-3__ sessions per day.  \Scapula Adduction With Pectorals, High -only if no pain   Stand in doorframe with palms against frame and arms at 120. Lean forward and squeeze shoulder blades. Hold _30__  seconds. Repeat _2-3__ times per session. Do _2__ sessions per day.    [facility] [place], [place] [address]  [phone] (office) [phone] (fax) .

## 2015-06-03 NOTE — Therapy (Signed)
[place], [place], [address] Phone: [phone]   Fax:  [phone]  Physical Therapy Evaluation  Patient Details  Name: Andre Cohen MRN: [id number] Date of Birth: 12-12-1970 Referring Provider: Dr Dianah Field  Encounter Date: 06/03/2015      PT End of Session - 06/03/15 1658    Visit Number 1   Number of Visits 1   PT Start Time 6962   PT Stop Time 1700   PT Time Calculation (min) 56 min   Activity Tolerance Patient tolerated treatment well      Past Medical History  Diagnosis Date  . LFT elevation     VITAMIN USE IN THE PAST    History reviewed. No pertinent past surgical history.  There were no vitals filed for this visit.  Visit Diagnosis:  Pain, joint, shoulder, right - Plan: PT plan of care cert/re-cert  Weakness of shoulder - Plan: PT plan of care cert/re-cert      Subjective Assessment - 06/03/15 1605    Subjective Pt reports he has played a lot of throwing sports over the years and still continues with his boys.  Noticed he was having trouble getting the balls to his boys and developed pain in the Rt shoulder. He has not had any injections, will follow up with MD in ~ 6 wks to see if he needs one.  Patient wishes to have one session to learn exercise.    Diagnostic tests x-rays - look good   Patient Stated Goals learn exercise to perform to strengthen the shoulder.  begin lifting free weights at [facility]   Currently in Pain? No/denies  only has pain with reaching behind him, throwing a baseball.             [facility] PT Assessment - 06/03/15 0001    Assessment   Medical Diagnosis Rt shoulder pain   Referring Provider Dr Dianah Field   Onset Date/Surgical Date 03/04/15   Hand Dominance Right   Next MD Visit 6-8 wks   Prior Therapy none   Precautions   Precautions None   Balance Screen   Has the patient fallen in the past 6 months No   Has the patient had a decrease in  activity level because of a fear of falling?  No   Is the patient reluctant to leave their home because of a fear of falling?  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    Observation/Other Assessments   Focus on Therapeutic Outcomes (FOTO)  33% limited   Posture/Postural Control   Posture/Postural Control Postural limitations   Postural Limitations Rounded Shoulders;Forward head;Increased thoracic kyphosis   ROM / Strength   AROM / PROM / Strength AROM;Strength   AROM   AROM Assessment Site Shoulder;Cervical   Right/Left Shoulder Right   Right Shoulder Extension --  WNL   Right Shoulder Flexion 148 Degrees   Right Shoulder ABduction --  WNL   Right Shoulder Internal Rotation --  42   Right Shoulder External Rotation 80 Degrees   Cervical Flexion WNL   Cervical Extension WNL   Cervical - Right Side Bend WNL   Cervical - Left Side Bend WNL   Cervical - Right Rotation WNL   Cervical - Left Rotation WNL   Strength   Strength Assessment Site Shoulder   Right/Left  Shoulder Right   Right Shoulder Flexion 5/5   Right Shoulder Extension 5/5   Right Shoulder ABduction 5/5   Right Shoulder Internal Rotation 5/5   Right Shoulder External Rotation 4+/5   Palpation   Palpation comment no tenderness around Rt shoulder complex  hypomobile Rt GH joint                   [facility] Adult PT Treatment/Exercise - 06/03/15 0001    Self-Care   Self-Care Posture  Simultaneous filing. User may not have seen previous data.   Exercises   Exercises Shoulder   Shoulder Exercises: Prone   Other Prone Exercises T's x 10 reps, repeated with 1# x 10    Shoulder Exercises: Standing   Horizontal ABduction Strengthening;Both;Theraband;10 reps   Theraband Level (Shoulder Horizontal ABduction) Level 2 (Red)   External Rotation Both;10 reps;Theraband   Theraband  Level (Shoulder External Rotation) Level 2 (Red)   Internal Rotation Strengthening;10 reps;Theraband;Right   Theraband Level (Shoulder Internal Rotation) Level 2 (Red)   Extension 10 reps;Strengthening;Right   Theraband Level (Shoulder Extension) Level 2 (Red)   Shoulder Exercises: Stretch   Other Shoulder Stretches doorway stretch - 3 positions x 30 sec each                 PT Education - 06/03/15 1601    Education provided Yes   Education Details HEP, shoulder mechanics and posture   Person(s) Educated Patient   Methods Explanation;Demonstration;Handout   Comprehension Returned demonstration;Verbalized understanding             PT Long Term Goals - 06/03/15 1558    PT LONG TERM GOAL #1   Title I with HEP  at eval.    Period --   Status Achieved   PT LONG TERM GOAL #2   Title --   Period --   Status --               Plan - 06/03/15 1648    Clinical Impression Statement 44 yo male presents with pain and higher functional limitations in his Rt shoulder. He is requesting one visit to learn HEP.    Rehab Potential Excellent  for goals set   PT Frequency One time visit   PT Next Visit Plan D/C to HEP   Consulted and Agree with Plan of Care Patient         Problem List Patient Active Problem List   Diagnosis Date Noted  . Right shoulder pain 05/22/2015  . Sleep apnea 03/17/2015  . Hyperlipidemia 08/21/2014  . Prediabetes 08/21/2014  . Annual physical exam 08/20/2014  . Steatohepatitis, non-alcoholic 58/30/9407    Jeral Pinch PT 06/03/2015, 4:58 PM  [facility] [place], [place], [address] Phone: [phone]   Fax:  [phone]  Name: Andre Cohen MRN: [id number] Date of Birth: [birth year]   PHYSICAL THERAPY DISCHARGE SUMMARY  Visits from Start of Care: 1  Current functional level related to goals / functional outcomes: See above  Remaining deficits: Patient  requests one visit to learn HEP   Education / Equipment: HEP Plan: Patient agrees to discharge.  Patient goals were met. Patient is being discharged due to meeting the stated rehab goals.  ?????       Jeral Pinch, PT 06/03/2015 4:58 PM

## 2015-06-05 ENCOUNTER — Telehealth: Payer: Self-pay | Admitting: Sports Medicine

## 2015-06-05 MED ORDER — AMBULATORY NON FORMULARY MEDICATION: Status: AC

## 2015-06-05 NOTE — Telephone Encounter (Signed)
Pt has CPAP with [facility]. Pt reports his CPAP is currently set on 15 cm H2O and he wakes "choking on air." Pt reports he contacted [facility] and was advised he just needs an order for the dosage to be changed. Will route to PCP.

## 2015-06-05 NOTE — Telephone Encounter (Signed)
Decrease to 12 cm H2O. Verbal order given.

## 2015-06-08 NOTE — Telephone Encounter (Signed)
Written order faxed.

## 2015-06-24 ENCOUNTER — Telehealth: Payer: Self-pay

## 2015-06-24 NOTE — Telephone Encounter (Signed)
Pt states 12 ist still too high on his CPAP machine and wondering if it's possible to take it down 10?  Heather from [place]

## 2015-06-25 NOTE — Telephone Encounter (Signed)
Left detailed message with return call information.

## 2015-06-25 NOTE — Telephone Encounter (Signed)
Yes, and give them an additional order for them to titrate the pressure themselves.

## 2015-06-29 DIAGNOSIS — G4733 Obstructive sleep apnea (adult) (pediatric): Secondary | ICD-10-CM | POA: Diagnosis not present

## 2015-07-10 ENCOUNTER — Encounter: Payer: Self-pay | Admitting: Sports Medicine

## 2015-07-23 ENCOUNTER — Ambulatory Visit: Payer: 59 | Admitting: Sports Medicine

## 2015-07-30 DIAGNOSIS — G4733 Obstructive sleep apnea (adult) (pediatric): Secondary | ICD-10-CM | POA: Diagnosis not present

## 2015-08-10 MED FILL — valACYclovir HCL 1 GM TABS: 1 | 30 days supply | Qty: 60 | Fill #1

## 2015-08-27 DIAGNOSIS — G4733 Obstructive sleep apnea (adult) (pediatric): Secondary | ICD-10-CM | POA: Diagnosis not present

## 2015-09-27 DIAGNOSIS — G4733 Obstructive sleep apnea (adult) (pediatric): Secondary | ICD-10-CM | POA: Diagnosis not present

## 2015-10-27 DIAGNOSIS — G4733 Obstructive sleep apnea (adult) (pediatric): Secondary | ICD-10-CM | POA: Diagnosis not present

## 2015-11-27 DIAGNOSIS — G4733 Obstructive sleep apnea (adult) (pediatric): Secondary | ICD-10-CM | POA: Diagnosis not present

## 2015-12-27 DIAGNOSIS — G4733 Obstructive sleep apnea (adult) (pediatric): Secondary | ICD-10-CM | POA: Diagnosis not present

## 2015-12-28 MED FILL — MELOXICAM 15 MG TABLET: 15 | 30 days supply | Qty: 30 | Fill #1

## 2015-12-30 ENCOUNTER — Encounter: Payer: Self-pay | Admitting: Sports Medicine

## 2015-12-30 ENCOUNTER — Ambulatory Visit (INDEPENDENT_AMBULATORY_CARE_PROVIDER_SITE_OTHER): Payer: 59 | Admitting: Sports Medicine

## 2015-12-30 ENCOUNTER — Ambulatory Visit (INDEPENDENT_AMBULATORY_CARE_PROVIDER_SITE_OTHER): Payer: 59

## 2015-12-30 VITALS — BP 128/81 | HR 83 | Resp 18 | Wt 206.7 lb

## 2015-12-30 DIAGNOSIS — M659 Synovitis and tenosynovitis, unspecified: Secondary | ICD-10-CM

## 2015-12-30 DIAGNOSIS — M25511 Pain in right shoulder: Secondary | ICD-10-CM

## 2015-12-30 DIAGNOSIS — M25532 Pain in left wrist: Secondary | ICD-10-CM | POA: Diagnosis not present

## 2015-12-30 DIAGNOSIS — M65939 Unspecified synovitis and tenosynovitis, unspecified forearm: Secondary | ICD-10-CM | POA: Insufficient documentation

## 2015-12-30 DIAGNOSIS — M25531 Pain in right wrist: Secondary | ICD-10-CM

## 2015-12-30 MED ORDER — PREDNISONE 50 MG PO TABS: ORAL_TABLET | ORAL | Status: DC

## 2015-12-30 MED ORDER — VALACYCLOVIR HCL 1 G PO TABS: 1000.0000 mg | ORAL_TABLET | Freq: Two times a day (BID) | ORAL | Status: DC

## 2015-12-30 MED ORDER — MELOXICAM 15 MG PO TABS: ORAL_TABLET | ORAL | Status: DC

## 2015-12-30 MED FILL — predniSONE 50 MG TABS: 50 | 5 days supply | Qty: 5 | Fill #0

## 2015-12-30 MED FILL — valACYclovir HCL 1 GM TABS: 1 | 7 days supply | Qty: 14 | Fill #0

## 2015-12-30 NOTE — Progress Notes (Signed)
  Subjective:    CC: Bilateral hand pain  HPI: This is a pleasant 45 year old male, on several occasions he has overdone it, be it working on his house, or playing hockey. After these episodes he has 2-3 days of stiffness in his hands with pain directly over the wrists as well as swelling. Symptoms are moderate, persistent. It occurred multiple times now, and symptoms are getting slower and slower to improve. He has tried meloxicam which has improved his symptoms slightly. No numbness or tingling, no trauma.  Past medical history, Surgical history, Family history not pertinant except as noted below, Social history, Allergies, and medications have been entered into the medical record, reviewed, and no changes needed.   Review of Systems: No fevers, chills, night sweats, weight loss, chest pain, or shortness of breath.   Objective:    General: Well Developed, well nourished, and in no acute distress.  Neuro: Alert and oriented x3, extra-ocular muscles intact, sensation grossly intact.  HEENT: Normocephalic, atraumatic, pupils equal round reactive to light, neck supple, no masses, no lymphadenopathy, thyroid nonpalpable.  Skin: Warm and dry, no rashes. Cardiac: Regular rate and rhythm, no murmurs rubs or gallops, no lower extremity edema.  Respiratory: Clear to auscultation bilaterally. Not using accessory muscles, speaking in full sentences. Bilateral Wrist: Inspection normal with no visible erythema , minimal swelling. Range of motion somewhat limited by pain, tender to palpation over the volar and dorsal radiocarpal joint bilaterally. No snuffbox tenderness. No tenderness over [place] of [place]. Strength 5/5 in all directions without pain. Negative Finkelstein, tinel's and phalens. Negative Watson's test.  Impression and Recommendations:    I spent 25 minutes with this patient, greater than 50% was face-to-face time counseling regarding the above diagnoses

## 2015-12-30 NOTE — Assessment & Plan Note (Signed)
Prednisone, Mobic, x-rays. Return to see me in one month, wrist joint injections if no better.

## 2016-01-12 ENCOUNTER — Encounter: Payer: 59 | Admitting: Sports Medicine

## 2016-01-27 ENCOUNTER — Ambulatory Visit (INDEPENDENT_AMBULATORY_CARE_PROVIDER_SITE_OTHER): Payer: 59 | Admitting: Sports Medicine

## 2016-01-27 ENCOUNTER — Encounter: Payer: Self-pay | Admitting: Sports Medicine

## 2016-01-27 DIAGNOSIS — M659 Synovitis and tenosynovitis, unspecified: Secondary | ICD-10-CM

## 2016-01-27 DIAGNOSIS — E785 Hyperlipidemia, unspecified: Secondary | ICD-10-CM | POA: Diagnosis not present

## 2016-01-27 DIAGNOSIS — M65939 Unspecified synovitis and tenosynovitis, unspecified forearm: Secondary | ICD-10-CM

## 2016-01-27 NOTE — Progress Notes (Signed)
  Subjective:    CC: Follow-up  HPI: Bilateral wrist pain: Diagnosed with synovitis at the last visit, all of his pain resolved with prednisone, meloxicam. Unfortunately he overdid it in the yard and then had a recurrence of pain which has again resolved.  Hyperlipidemia: Needs new blood work.  Prediabetes: Needs updated blood work  Hyperkalemia: Needs updated blood work.  Past medical history, Surgical history, Family history not pertinant except as noted below, Social history, Allergies, and medications have been entered into the medical record, reviewed, and no changes needed.   Review of Systems: No fevers, chills, night sweats, weight loss, chest pain, or shortness of breath.   Objective:    General: Well Developed, well nourished, and in no acute distress.  Neuro: Alert and oriented x3, extra-ocular muscles intact, sensation grossly intact.  HEENT: Normocephalic, atraumatic, pupils equal round reactive to light, neck supple, no masses, no lymphadenopathy, thyroid nonpalpable.  Skin: Warm and dry, no rashes. Cardiac: Regular rate and rhythm, no murmurs rubs or gallops, no lower extremity edema.  Respiratory: Clear to auscultation bilaterally. Not using accessory muscles, speaking in full sentences.  Impression and Recommendations:    Synovitis of both wrists Doing better with prednisone and Mobic. No need for radiocarpal injections, we can do another course of prednisone if pain recurs.  Hyperlipidemia Rechecking lipids.  I spent 25 minutes with this patient, greater than 50% was face-to-face time counseling regarding the above diagnoses

## 2016-01-27 NOTE — Assessment & Plan Note (Addendum)
Doing better with prednisone and Mobic. No need for radiocarpal injections, we can do another course of prednisone if pain recurs.

## 2016-01-27 NOTE — Assessment & Plan Note (Signed)
Rechecking lipids. 

## 2016-02-08 MED FILL — MELOXICAM 15 MG TABLET: 15 | 30 days supply | Qty: 30 | Fill #2

## 2016-03-02 DIAGNOSIS — G4733 Obstructive sleep apnea (adult) (pediatric): Secondary | ICD-10-CM | POA: Diagnosis not present

## 2016-03-09 MED FILL — MELOXICAM 15 MG TABLET: 15 | 30 days supply | Qty: 30 | Fill #3

## 2016-03-21 DIAGNOSIS — G5603 Carpal tunnel syndrome, bilateral upper limbs: Secondary | ICD-10-CM | POA: Diagnosis not present

## 2016-03-21 DIAGNOSIS — R52 Pain, unspecified: Secondary | ICD-10-CM | POA: Diagnosis not present

## 2016-03-24 ENCOUNTER — Telehealth: Payer: Self-pay

## 2016-03-24 MED ORDER — PREDNISONE 50 MG PO TABS: ORAL_TABLET | ORAL | 0 refills | Status: DC

## 2016-03-24 MED FILL — predniSONE 50 MG TABS: 50 | 5 days supply | Qty: 5 | Fill #0

## 2016-03-24 NOTE — Telephone Encounter (Signed)
Wife of pt left VM stating he's having another flare-up in both hands and that you told pt you would send in prednisone if the flare came back. Please assist.

## 2016-03-24 NOTE — Telephone Encounter (Signed)
Prednisone burst called in. 

## 2016-04-06 ENCOUNTER — Encounter (HOSPITAL_BASED_OUTPATIENT_CLINIC_OR_DEPARTMENT_OTHER): Payer: Self-pay | Admitting: *Deleted

## 2016-04-06 DIAGNOSIS — G5603 Carpal tunnel syndrome, bilateral upper limbs: Secondary | ICD-10-CM | POA: Diagnosis not present

## 2016-04-07 ENCOUNTER — Telehealth: Payer: Self-pay

## 2016-04-07 ENCOUNTER — Other Ambulatory Visit: Payer: Self-pay | Admitting: Orthopedic Surgery

## 2016-04-07 MED ORDER — HYDROXYZINE HCL 50 MG PO TABS: 50.0000 mg | ORAL_TABLET | Freq: Every day | ORAL | 3 refills | Status: DC

## 2016-04-07 NOTE — Telephone Encounter (Signed)
Pain medication will be prescribed by the surgeon, I will call in hydroxyzine for sleep.

## 2016-04-07 NOTE — Telephone Encounter (Signed)
Pt left VM stating he has a carpal tunnel surgery coming up after seeing a specialist. Would like to know if you will prescribe him some pain and sleep medications. Please advise.

## 2016-04-08 MED FILL — hydrOXYzine HCL 50 MG TABS: 50 | 30 days supply | Qty: 30 | Fill #0

## 2016-04-08 NOTE — Telephone Encounter (Signed)
Left VM with information.  

## 2016-04-11 MED FILL — MELOXICAM 15 MG TABLET: 15 | 90 days supply | Qty: 90 | Fill #0

## 2016-04-15 ENCOUNTER — Ambulatory Visit (HOSPITAL_BASED_OUTPATIENT_CLINIC_OR_DEPARTMENT_OTHER)
Admission: RE | Admit: 2016-04-15 | Discharge: 2016-04-15 | Disposition: A | Discharge status: Home / Self Care | Payer: 59 | Source: Ambulatory Visit | Attending: Orthopedic Surgery | Admitting: Orthopedic Surgery

## 2016-04-15 ENCOUNTER — Encounter (HOSPITAL_BASED_OUTPATIENT_CLINIC_OR_DEPARTMENT_OTHER)
Admission: RE | Disposition: A | Discharge status: Home / Self Care | Payer: Self-pay | Source: Ambulatory Visit | Attending: Orthopedic Surgery

## 2016-04-15 ENCOUNTER — Ambulatory Visit (HOSPITAL_BASED_OUTPATIENT_CLINIC_OR_DEPARTMENT_OTHER): Payer: 59 | Admitting: Anesthesiology

## 2016-04-15 ENCOUNTER — Encounter (HOSPITAL_BASED_OUTPATIENT_CLINIC_OR_DEPARTMENT_OTHER): Payer: Self-pay | Admitting: Certified Registered"

## 2016-04-15 DIAGNOSIS — J45909 Unspecified asthma, uncomplicated: Secondary | ICD-10-CM | POA: Insufficient documentation

## 2016-04-15 DIAGNOSIS — G4730 Sleep apnea, unspecified: Secondary | ICD-10-CM | POA: Insufficient documentation

## 2016-04-15 DIAGNOSIS — G5601 Carpal tunnel syndrome, right upper limb: Secondary | ICD-10-CM | POA: Insufficient documentation

## 2016-04-15 HISTORY — DX: Unspecified asthma, uncomplicated: J45.909

## 2016-04-15 HISTORY — DX: Sleep apnea, unspecified: G47.30

## 2016-04-15 HISTORY — PX: CARPAL TUNNEL RELEASE: SHX101

## 2016-04-15 HISTORY — DX: Carpal tunnel syndrome, bilateral upper limbs: G56.03

## 2016-04-15 SURGERY — CARPAL TUNNEL RELEASE
Anesthesia: Monitor Anesthesia Care | Site: Wrist | Laterality: Right

## 2016-04-15 MED ORDER — PROPOFOL 10 MG/ML IV BOLUS
INTRAVENOUS | Status: DC | PRN
  Administered 2016-04-15: 30 mg via INTRAVENOUS

## 2016-04-15 MED ORDER — MIDAZOLAM HCL 2 MG/2ML IJ SOLN
1.0000 mg | INTRAMUSCULAR | Status: DC | PRN
  Administered 2016-04-15 (×2): 1 mg via INTRAVENOUS

## 2016-04-15 MED ORDER — CEFAZOLIN SODIUM-DEXTROSE 2-4 GM/100ML-% IV SOLN
INTRAVENOUS | Status: AC
  Filled 2016-04-15: qty 100

## 2016-04-15 MED ORDER — LIDOCAINE HCL (CARDIAC) 20 MG/ML IV SOLN
INTRAVENOUS | Status: DC | PRN
  Administered 2016-04-15: 20 mg via INTRAVENOUS

## 2016-04-15 MED ORDER — BUPIVACAINE HCL (PF) 0.25 % IJ SOLN
INTRAMUSCULAR | Status: DC | PRN
  Administered 2016-04-15: 7 mL

## 2016-04-15 MED ORDER — LIDOCAINE 2% (20 MG/ML) 5 ML SYRINGE
INTRAMUSCULAR | Status: AC
Start: 2016-04-15 — End: 2016-04-15
  Filled 2016-04-15: qty 5

## 2016-04-15 MED ORDER — HYDROCODONE-ACETAMINOPHEN 5-325 MG PO TABS: 1.0000 | ORAL_TABLET | Freq: Four times a day (QID) | ORAL | 0 refills | Status: DC | PRN

## 2016-04-15 MED ORDER — FENTANYL CITRATE (PF) 100 MCG/2ML IJ SOLN
50.0000 ug | INTRAMUSCULAR | Status: DC | PRN
  Administered 2016-04-15 (×2): 50 ug via INTRAVENOUS

## 2016-04-15 MED ORDER — LIDOCAINE HCL (PF) 0.5 % IJ SOLN
INTRAMUSCULAR | Status: DC | PRN
  Administered 2016-04-15: 30 mL via INTRAVENOUS

## 2016-04-15 MED ORDER — CHLORHEXIDINE GLUCONATE 4 % EX LIQD: 60.0000 mL | Freq: Once | CUTANEOUS | Status: DC

## 2016-04-15 MED ORDER — SCOPOLAMINE 1 MG/3DAYS TD PT72: 1.0000 | MEDICATED_PATCH | Freq: Once | TRANSDERMAL | Status: DC | PRN

## 2016-04-15 MED ORDER — CEFAZOLIN SODIUM-DEXTROSE 2-4 GM/100ML-% IV SOLN
2.0000 g | INTRAVENOUS | Status: AC
  Administered 2016-04-15: 2 g via INTRAVENOUS

## 2016-04-15 MED ORDER — FENTANYL CITRATE (PF) 100 MCG/2ML IJ SOLN
INTRAMUSCULAR | Status: AC
  Filled 2016-04-15: qty 2

## 2016-04-15 MED ORDER — ONDANSETRON HCL 4 MG/2ML IJ SOLN
INTRAMUSCULAR | Status: AC
  Filled 2016-04-15: qty 2

## 2016-04-15 MED ORDER — ONDANSETRON HCL 4 MG/2ML IJ SOLN
INTRAMUSCULAR | Status: DC | PRN
  Administered 2016-04-15: 4 mg via INTRAVENOUS

## 2016-04-15 MED ORDER — GLYCOPYRROLATE 0.2 MG/ML IJ SOLN: 0.2000 mg | Freq: Once | INTRAMUSCULAR | Status: DC | PRN

## 2016-04-15 MED ORDER — BUPIVACAINE HCL (PF) 0.25 % IJ SOLN
INTRAMUSCULAR | Status: AC
  Filled 2016-04-15: qty 60

## 2016-04-15 MED ORDER — LACTATED RINGERS IV SOLN
INTRAVENOUS | Status: DC
  Administered 2016-04-15: 10 mL/h via INTRAVENOUS
  Administered 2016-04-15: 13:00:00 via INTRAVENOUS

## 2016-04-15 MED ORDER — MIDAZOLAM HCL 2 MG/2ML IJ SOLN
INTRAMUSCULAR | Status: AC
  Filled 2016-04-15: qty 2

## 2016-04-15 MED FILL — HYDROCODON-APAP 5-325: 5-325 | 5 days supply | Qty: 20 | Fill #0

## 2016-04-15 SURGICAL SUPPLY — 34 items
BLADE SURG 15 STRL LF DISP TIS (BLADE) ×1 IMPLANT
BLADE SURG 15 STRL SS (BLADE) ×1
BNDG COHESIVE 3X5 TAN STRL LF (GAUZE/BANDAGES/DRESSINGS) ×2 IMPLANT
BNDG ESMARK 4X9 LF (GAUZE/BANDAGES/DRESSINGS) IMPLANT
BNDG GAUZE ELAST 4 BULKY (GAUZE/BANDAGES/DRESSINGS) ×2 IMPLANT
CHLORAPREP W/TINT 26ML (MISCELLANEOUS) ×2 IMPLANT
CORDS BIPOLAR (ELECTRODE) ×2 IMPLANT
COVER BACK TABLE 60X90IN (DRAPES) ×2 IMPLANT
COVER MAYO STAND STRL (DRAPES) ×2 IMPLANT
CUFF TOURNIQUET SINGLE 18IN (TOURNIQUET CUFF) ×2 IMPLANT
DRAPE EXTREMITY T 121X128X90 (DRAPE) ×2 IMPLANT
DRAPE SURG 17X23 STRL (DRAPES) ×2 IMPLANT
DRSG PAD ABDOMINAL 8X10 ST (GAUZE/BANDAGES/DRESSINGS) ×2 IMPLANT
GAUZE SPONGE 4X4 12PLY STRL (GAUZE/BANDAGES/DRESSINGS) ×2 IMPLANT
GAUZE XEROFORM 1X8 LF (GAUZE/BANDAGES/DRESSINGS) ×2 IMPLANT
GLOVE BIOGEL PI IND STRL 7.0 (GLOVE) ×1 IMPLANT
GLOVE BIOGEL PI IND STRL 8.5 (GLOVE) ×1 IMPLANT
GLOVE BIOGEL PI INDICATOR 7.0 (GLOVE) ×1
GLOVE BIOGEL PI INDICATOR 8.5 (GLOVE) ×1
GLOVE ECLIPSE 6.5 STRL STRAW (GLOVE) ×2 IMPLANT
GLOVE SURG ORTHO 8.0 STRL STRW (GLOVE) ×2 IMPLANT
GOWN STRL REUS W/ TWL LRG LVL3 (GOWN DISPOSABLE) ×1 IMPLANT
GOWN STRL REUS W/TWL LRG LVL3 (GOWN DISPOSABLE) ×1
GOWN STRL REUS W/TWL XL LVL3 (GOWN DISPOSABLE) ×2 IMPLANT
NEEDLE PRECISIONGLIDE 27X1.5 (NEEDLE) ×2 IMPLANT
NS IRRIG 1000ML POUR BTL (IV SOLUTION) ×2 IMPLANT
PACK BASIN DAY SURGERY FS (CUSTOM PROCEDURE TRAY) ×2 IMPLANT
STOCKINETTE 4X48 STRL (DRAPES) ×2 IMPLANT
SUT ETHILON 4 0 PS 2 18 (SUTURE) ×2 IMPLANT
SUT VICRYL 4-0 PS2 18IN ABS (SUTURE) IMPLANT
SYR BULB 3OZ (MISCELLANEOUS) ×2 IMPLANT
SYR CONTROL 10ML LL (SYRINGE) ×2 IMPLANT
TOWEL OR 17X24 6PK STRL BLUE (TOWEL DISPOSABLE) ×2 IMPLANT
UNDERPAD 30X30 (UNDERPADS AND DIAPERS) ×2 IMPLANT

## 2016-04-15 NOTE — Op Note (Signed)
Dictation Number [id number]

## 2016-04-15 NOTE — H&P (Signed)
Andre Cohen is an 45 y.o. male.   Chief Complaint: numbness hands HPI: Andre Cohen comes to the office with a complaint of pain both hands with feeling of swelling. He is 45 years old right-handed. He states it began in May after painting a garage. He was able to part of this his hands felt heavy swelling he was unable to use them the day became stiff. He had this gradually settled down to weeks later he went to repaint the opposite side which reproduces symptoms form he is not complaining of discomfort right greater than left. He has seen Dr. Darene Lamer who has given him wrist splints Medrol Dosepak which gave him relief. He states he is taking meloxicam 15 mg once a day. This has given him some relief but he does not notice it until he stops taking it. His weight and 2 out of 7 nights. He went a hockey game where was cold this causes stiffness and swelling of his hand. He states writing causes increased discomfort and he has pain in the palm of his hand. He has no history of injury to the hand to the neck to the elbow. He has tingling thumb to ring fingers. He has no history diabetes thyroid problems arthritis or gout. Family history is negative for diabetes thyroid problems arthritis but positive for gout in his father. Lab Studies: His nerve conductions done by Dr. Thereasa Parkin are just discussed with him. He shows a motor delay of 5.47 on the right 4.95 on the left. He shows enlargement of the median nerves bilaterally to ultrasound. We have discussed this along with his waveforms with him.      Allergies:  Allergies  Allergen Reactions  . Doxycycline Rash (ALLERGY/intolerance)       Past Medical History:  Diagnosis Date  . Asthma   . Carpal tunnel syndrome on both sides   . LFT elevation    VITAMIN USE IN THE PAST  . Sleep apnea    wear CPAP nightly    Past Surgical History:  Procedure Laterality Date  . WISDOM TOOTH EXTRACTION      Family History  Problem Relation Age of  Onset  . Hypertension Mother   . Hypertension Father   . Hypertension Sister   . Hypertension Brother   . Cancer Brother     skin   Social History:  reports that he has never smoked. He has never used smokeless tobacco. He reports that he drinks alcohol. He reports that he does not use drugs.  Allergies:  Allergies  Allergen Reactions  . Doxycycline Rash    No prescriptions prior to admission.    No results found for this or any previous visit (from the past 48 hour(s)).  No results found.   Pertinent items are noted in HPI.  Height 6' (1.829 m), weight 95.3 kg (210 lb).  General appearance: alert, cooperative and appears stated age Head: Normocephalic, without obvious abnormality Neck: no JVD Resp: clear to auscultation bilaterally Cardio: regular rate and rhythm, S1, S2 normal, no murmur, click, rub or gallop GI: soft, non-tender; bowel sounds normal; no masses,  no organomegaly Extremities: numbness bilateral hands Pulses: 2+ and symmetric Skin: Skin color, texture, turgor normal. No rashes or lesions Neurologic: Grossly normal Incision/Wound: na  Assessment/Plan Assessment:  1. Bilateral carpal tunnel syndrome    Plan: We have discussed the possibility of injections with him. He is advised that these will be temporary. He is wondering about having surgery done.. Pre peri  and Postoperative course are discussed along with risks and complications. He is aware that there is no guarantee to the surgery the possibility of infection recurrence injury to arteries nerves tendons and incomplete relief of symptoms along with dystrophy. Questions are encouraged and answered to his satisfaction. He would like to have an injection on the left side and I be scheduled for carpal tunnel release on his right side. He is scheduled for right carpal tunnel release and outpatient under regional anesthesia      Caylah Plouff R 04/15/2016, 12:06 PM

## 2016-04-15 NOTE — Op Note (Signed)
NAME:  Andre Cohen, Andre Cohen NO.:  [id number]  MEDICAL RECORD NO.:  [id number]  LOCATION:                                 FACILITY:  PHYSICIAN:  Daryll Brod, M.D.            DATE OF BIRTH:  DATE OF PROCEDURE:  04/15/2016 DATE OF DISCHARGE:                              OPERATIVE REPORT   PREOPERATIVE DIAGNOSIS:  Carpal tunnel syndrome, right hand.  POSTOPERATIVE DIAGNOSIS:  Carpal tunnel syndrome, right hand.  OPERATION:  Decompression right median nerve.  SURGEON:  Daryll Brod, MD.  ANESTHESIA:  Forearm-based IV regional with local infiltration.  PLACE OF SURGERY:  [facility].  HISTORY:  The patient is a 45 year old male with a history of carpal tunnel syndrome, right hand.  Pre, peri, and postoperative course have been discussed along with risks and complications.  This has not responded to conservative treatment.  He has elected to undergo surgical decompression of the median nerve.  In the preoperative area, the patient was seen, the extremity marked by both the patient and surgeon and antibiotic given.  PROCEDURE IN DETAIL:  The patient was brought to the operating room, where a forearm-based IV regional anesthetic was carried out without difficulty under the direction of the Anesthesia Department.  He was prepped using ChloraPrep, supine position with the right arm free.  A 3- minute dry time was allowed and a time-out taken confirming the patient and procedure.  A longitudinal incision was made in the right palm, carried down through subcutaneous tissue.  Bleeders were electrocauterized with bipolar.  Palmar fascia was split.  Superficial palmar arch was identified.  The flexor tendon of the ring and little finger was identified.  Retractors were placed to protect the median nerve radially and the ulnar nerve ulnarly.  An incision was then made through the flexor retinaculum on its ulnar border.  A right angle and Sewall retractor were placed  between skin and forearm fascia. Dissection was carried out dorsally to the forearm fascia separating off the median nerve.  With blunt scissors, the forearm fascia was then released for approximately 2 cm proximal to the wrist crease under direct vision.  The canal was explored. An area of compression to the nerve was apparent.  No further lesions were identified.  Motor branch entered in the muscle distally.  The wound was then irrigated with saline.  The skin closed with interrupted 4-0 nylon sutures.  At the beginning of the case, injection was given with 0.25% bupivacaine without epinephrine.  This was supplemented at the end of the case with a total of 8 mL.  A sterile compressive dressing was applied.  On deflation of the tourniquet, all fingers immediately pinked.  He was taken to the recovery room for observation in satisfactory condition.  He will be discharged home to return to the [facility] in 1 week, on Norco.          ______________________________ Daryll Brod, M.D.     GK/MEDQ  D:  04/15/2016  T:  04/15/2016  Job:  [id number]

## 2016-04-15 NOTE — Anesthesia Procedure Notes (Signed)
Procedure Name: MAC Date/Time: 04/15/2016 12:56 PM Performed by: Baxter Flattery Pre-anesthesia Checklist: Patient identified, Emergency Drugs available, Patient being monitored, Suction available and Timeout performed Patient Re-evaluated:Patient Re-evaluated prior to inductionOxygen Delivery Method: Simple face mask Preoxygenation: Pre-oxygenation with 100% oxygen Intubation Type: IV induction Ventilation: Mask ventilation without difficulty Dental Injury: Teeth and Oropharynx as per pre-operative assessment

## 2016-04-15 NOTE — Anesthesia Postprocedure Evaluation (Signed)
Anesthesia Post Note  Patient: Andre Cohen  Procedure(s) Performed: Procedure(s) (LRB): CARPAL TUNNEL RELEASE, right (Right)  Patient location during evaluation: PACU Anesthesia Type: Bier Block Level of consciousness: awake and alert Pain management: pain level controlled Vital Signs Assessment: post-procedure vital signs reviewed and stable Respiratory status: spontaneous breathing Cardiovascular status: stable Postop Assessment: no signs of nausea or vomiting Anesthetic complications: no    Last Vitals:  Vitals:   04/15/16 1345 04/15/16 1359  BP: (!) 134/93 (!) 134/100  Pulse: 66 67  Resp: 12 18  Temp:  36.8 C    Last Pain:  Vitals:   04/15/16 1359  TempSrc:   PainSc: 0-No pain                 Khala Tarte

## 2016-04-15 NOTE — Discharge Instructions (Addendum)

## 2016-04-15 NOTE — Transfer of Care (Signed)
Immediate Anesthesia Transfer of Care Note  Patient: Andre Cohen  Procedure(s) Performed: Procedure(s) with comments: CARPAL TUNNEL RELEASE, right (Right) - FAB  Patient Location: PACU  Anesthesia Type:MAC and Bier block  Level of Consciousness: awake, alert , oriented and patient cooperative  Airway & Oxygen Therapy: Patient Spontanous Breathing and Patient connected to face mask oxygen  Post-op Assessment: Report given to RN, Post -op Vital signs reviewed and stable and Patient moving all extremities  Post vital signs: Reviewed and stable  Last Vitals:  Vitals:   04/15/16 1236  BP: (!) 150/82  Resp: 18  Temp: 37.1 C    Last Pain:  Vitals:   04/15/16 1236  TempSrc: Oral  PainSc: 2       Patients Stated Pain Goal: 4 ([id number])  Complications: No apparent anesthesia complications

## 2016-04-15 NOTE — Anesthesia Preprocedure Evaluation (Signed)
Anesthesia Evaluation  Patient identified by MRN, date of birth, ID band Patient awake    Reviewed: Allergy & Precautions, NPO status , Patient's Chart, lab work & pertinent test results  History of Anesthesia Complications Negative for: history of anesthetic complications  Airway Mallampati: II  TM Distance: >3 FB Neck ROM: Full    Dental  (+) Teeth Intact   Pulmonary asthma , sleep apnea and Continuous Positive Airway Pressure Ventilation ,    breath sounds clear to auscultation       Cardiovascular negative cardio ROS   Rhythm:Regular     Neuro/Psych neg Seizures  Neuromuscular disease negative psych ROS   GI/Hepatic negative GI ROS, Neg liver ROS,   Endo/Other  negative endocrine ROS  Renal/GU negative Renal ROS     Musculoskeletal  (+) Arthritis ,   Abdominal   Peds  Hematology negative hematology ROS (+)   Anesthesia Other Findings   Reproductive/Obstetrics                             Anesthesia Physical Anesthesia Plan  ASA: II  Anesthesia Plan: Bier Block   Post-op Pain Management:    Induction:   Airway Management Planned: Natural Airway, Nasal Cannula and Simple Face Mask  Additional Equipment: None  Intra-op Plan:   Post-operative Plan:   Informed Consent: I have reviewed the patients History and Physical, chart, labs and discussed the procedure including the risks, benefits and alternatives for the proposed anesthesia with the patient or authorized representative who has indicated his/her understanding and acceptance.   Dental advisory given  Plan Discussed with: CRNA and Surgeon  Anesthesia Plan Comments:         Anesthesia Quick Evaluation

## 2016-04-15 NOTE — Brief Op Note (Signed)
04/15/2016  1:34 PM  PATIENT:  Andre Cohen  45 y.o. male  PRE-OPERATIVE DIAGNOSIS:  right carpal tunnel  POST-OPERATIVE DIAGNOSIS:  right carpal tunnel  PROCEDURE:  Procedure(s) with comments: CARPAL TUNNEL RELEASE, right (Right) - FAB  SURGEON:  Surgeon(s) and Role:    * Daryll Brod, MD - Primary  PHYSICIAN ASSISTANT:   ASSISTANTS: none   ANESTHESIA:   local and regional  EBL:  Total I/O In: 400 [I.V.:400] Out: 2 [Blood:2]  BLOOD ADMINISTERED:none  DRAINS: none   LOCAL MEDICATIONS USED:  BUPIVICAINE   SPECIMEN:  No Specimen  DISPOSITION OF SPECIMEN:  N/A  COUNTS:  YES  TOURNIQUET:   Total Tourniquet Time Documented: Forearm (Right) - 3 minutes Forearm (Right) - 17 minutes Total: Forearm (Right) - 20 minutes   DICTATION: .Other Dictation: Dictation Number [id number]  PLAN OF CARE: d/c to home  PATIENT DISPOSITION:  PACU - hemodynamically stable.

## 2016-04-18 ENCOUNTER — Encounter (HOSPITAL_BASED_OUTPATIENT_CLINIC_OR_DEPARTMENT_OTHER): Payer: Self-pay | Admitting: Orthopedic Surgery

## 2016-04-19 ENCOUNTER — Encounter: Payer: Self-pay | Admitting: Cardiology

## 2016-04-21 ENCOUNTER — Encounter: Payer: Self-pay | Admitting: Sports Medicine

## 2016-04-23 ENCOUNTER — Emergency Department
Admission: EM | Admit: 2016-04-23 | Discharge: 2016-04-23 | Disposition: A | Discharge status: Home / Self Care | Payer: 59 | Source: Home / Self Care | Attending: Family Medicine | Admitting: Family Medicine

## 2016-04-23 ENCOUNTER — Encounter: Payer: Self-pay | Admitting: Emergency Medicine

## 2016-04-23 DIAGNOSIS — R5383 Other fatigue: Secondary | ICD-10-CM

## 2016-04-23 DIAGNOSIS — R509 Fever, unspecified: Secondary | ICD-10-CM

## 2016-04-23 DIAGNOSIS — N390 Urinary tract infection, site not specified: Secondary | ICD-10-CM | POA: Diagnosis not present

## 2016-04-23 DIAGNOSIS — R300 Dysuria: Secondary | ICD-10-CM

## 2016-04-23 LAB — POCT URINALYSIS DIP (MANUAL ENTRY)
Bilirubin, UA: NEGATIVE
Glucose, UA: NEGATIVE
Ketones, POC UA: NEGATIVE
Nitrite, UA: NEGATIVE
Spec Grav, UA: 1.025 (ref 1.005–1.03)
Urobilinogen, UA: NEGATIVE (ref 0–1)
pH, UA: 6 (ref 5–8)

## 2016-04-23 MED ORDER — SULFAMETHOXAZOLE-TRIMETHOPRIM 800-160 MG PO TABS: 1.0000 | ORAL_TABLET | Freq: Two times a day (BID) | ORAL | 0 refills | Status: AC

## 2016-04-23 NOTE — Discharge Instructions (Signed)
°  Pyridium has been prescribed to help with urinary discomfort and bladder spasms.  This medication can cause your urine to be orange, this is normal. ° °

## 2016-04-23 NOTE — ED Provider Notes (Signed)
CSN: [id number]     Arrival date & time 04/23/16  [id number] History   First MD Initiated Contact with Patient 04/23/16 1027     Chief Complaint  Patient presents with  . Urinary Tract Infection   (Consider location/radiation/quality/duration/timing/severity/associated sxs/prior Treatment) HPI Andre Cohen is a 45 y.o. male presenting to UC with c/o painful urination, fever up to 102*F, urinary frequency, and fatigue for about 2 days.  He reports having a Positive Azo test at home.  He believes he may have had a UTI many years ago.  He notes he did have carpel tunnel surgery on Right wrist last week but denies complications with the surgery and does not believe the fever is from his wrist.  Denies concern for STIs as pt is married in a monogamous relationship. Denies hx of prostate problems.    Past Medical History:  Diagnosis Date  . Asthma   . Carpal tunnel syndrome on both sides   . LFT elevation    VITAMIN USE IN THE PAST  . Sleep apnea    wear CPAP nightly   Past Surgical History:  Procedure Laterality Date  . CARPAL TUNNEL RELEASE Right 04/15/2016   Procedure: CARPAL TUNNEL RELEASE, right;  Surgeon: Daryll Brod, MD;  Location: [place];  Service: Orthopedics;  Laterality: Right;  FAB  . WISDOM TOOTH EXTRACTION     Family History  Problem Relation Age of Onset  . Hypertension Mother   . Hypertension Father   . Hypertension Sister   . Hypertension Brother   . Cancer Brother     skin   Social History  Substance Use Topics  . Smoking status: Never Smoker  . Smokeless tobacco: Never Used  . Alcohol use Yes     Comment: social    Review of Systems  Constitutional: Positive for chills, diaphoresis and fever.  HENT: Negative for congestion, sinus pressure, sneezing and sore throat.   Respiratory: Negative for cough, chest tightness and shortness of breath.   Gastrointestinal: Negative for abdominal pain, diarrhea, nausea and vomiting.  Genitourinary:  Positive for dysuria, frequency and urgency. Negative for discharge, flank pain, hematuria and penile pain.  Musculoskeletal: Negative for back pain and myalgias.  Skin: Positive for wound (surgical wound Right wrist.). Negative for color change.    Allergies  Doxycycline  Home Medications   Prior to Admission medications   Medication Sig Start Date End Date Taking? Authorizing Provider  albuterol (PROVENTIL HFA;VENTOLIN HFA) 108 (90 BASE) MCG/ACT inhaler Inhale 2 puffs into the lungs every 6 (six) hours as needed for wheezing or shortness of breath. 03/31/15   Emeterio Reeve, DO  AMBULATORY NON FORMULARY MEDICATION Continuous positive airway pressure (CPAP) machine on auto pressure titration, if unable the set at 12 cm of H2O pressure, with all supplemental supplies as needed. 06/05/15   Silverio Decamp, MD  HYDROcodone-acetaminophen (NORCO) 5-325 MG tablet Take 1 tablet by mouth every 6 (six) hours as needed for moderate pain. 04/15/16   Daryll Brod, MD  hydrOXYzine (ATARAX/VISTARIL) 50 MG tablet Take 1 tablet (50 mg total) by mouth at bedtime. 04/07/16   Silverio Decamp, MD  meloxicam (MOBIC) 15 MG tablet One tab PO qAM with breakfast for 2 weeks, then daily prn pain. 12/30/15   Silverio Decamp, MD  sulfamethoxazole-trimethoprim (BACTRIM DS,SEPTRA DS) 800-160 MG tablet Take 1 tablet by mouth 2 (two) times daily. 04/23/16 04/30/16  Noland Fordyce, PA-C  valACYclovir (VALTREX) 1000 MG tablet Take 1 tablet (1,000 mg total)  by mouth 2 (two) times daily. 12/30/15   Silverio Decamp, MD   Meds Ordered and Administered this Visit  Medications - No data to display  BP 114/82 (BP Location: Right Arm)   Pulse 116   Temp 98.6 F (37 C) (Oral)   Wt 203 lb (92.1 kg)   SpO2 99%   BMI 27.53 kg/m  No data found.   Physical Exam  Constitutional: He is oriented to person, place, and time. He appears well-developed and well-nourished. No distress.  Pt sitting on exam bed,  appears well, NAD  HENT:  Head: Normocephalic and atraumatic.  Right Ear: Tympanic membrane normal.  Left Ear: Tympanic membrane normal.  Nose: Nose normal.  Mouth/Throat: Uvula is midline, oropharynx is clear and moist and mucous membranes are normal.  Eyes: EOM are normal.  Neck: Normal range of motion. Neck supple.  Cardiovascular: Normal rate and regular rhythm.   Pulmonary/Chest: Effort normal and breath sounds normal. No respiratory distress. He has no wheezes. He has no rales.  Abdominal: Soft. He exhibits no distension and no mass. There is no tenderness. There is no rebound, no guarding and no CVA tenderness.  Musculoskeletal: Normal range of motion. He exhibits no edema or tenderness.  Right wrist: full ROM, surgical wound covered with clean dry bandage.  Neurological: He is alert and oriented to person, place, and time.  Skin: Skin is warm and dry. Capillary refill takes less than 2 seconds. He is not diaphoretic. No erythema.  Psychiatric: He has a normal mood and affect. His behavior is normal.  Nursing note and vitals reviewed.   Urgent Care Course   Clinical Course    Procedures (including critical care time)  Labs Review Labs Reviewed  POCT URINALYSIS DIP (MANUAL ENTRY) - Abnormal; Notable for the following:       Result Value   Blood, UA trace-intact (*)    Protein Ur, POC trace (*)    Leukocytes, UA Trace (*)    All other components within normal limits  URINE CULTURE    Imaging Review No results found.    MDM   1. Dysuria   2. Fever and chills   3. Other fatigue    Pt c/o urinary symptoms, fatigue, and fever with chills for 2 days. Recent carpal tunnel surgery.  UA: possible mild UTI, will send culture and start empiric antibiotic treatment.  Right wrist: not concerning for underlying infection from surgery.  Rx: Bactrim Encouraged f/u with PCP in 3-4 days if not improving, sooner if worsening.  Pt will be notified when culture results come  back.     Noland Fordyce, PA-C 04/23/16 1235

## 2016-04-23 NOTE — ED Triage Notes (Signed)
Pt c/o painful urination and fever x2 days. States he had a positive AZO at home test for UTI. He also had carpel tunnel surgery last week but denies complications with this.

## 2016-04-25 ENCOUNTER — Telehealth: Payer: Self-pay | Admitting: *Deleted

## 2016-04-25 LAB — URINE CULTURE

## 2016-04-25 NOTE — Telephone Encounter (Signed)
LM to call back for Ucx results. Per dr Assunta Found stop bactrim and start levaquin 500mg   1 po QD # 7/ 0rf. Charna Archer, LPN

## 2016-04-26 ENCOUNTER — Telehealth: Payer: Self-pay | Admitting: *Deleted

## 2016-04-26 NOTE — Telephone Encounter (Signed)
LM to call back for Ucx results. Nabeeha Badertscher, LPN  

## 2016-04-27 ENCOUNTER — Telehealth: Payer: Self-pay | Admitting: *Deleted

## 2016-04-27 MED ORDER — LEVOFLOXACIN 500 MG PO TABS: 500.0000 mg | ORAL_TABLET | Freq: Every day | ORAL | 0 refills | Status: DC

## 2016-04-27 MED FILL — levoFLOXacin 500 MG TABS: 500 | 7 days supply | Qty: 7 | Fill #0

## 2016-04-27 NOTE — Telephone Encounter (Signed)
[place] called back Ucx results given. He reports he no longer has a fever, but still has dysuria. rx called to [facility] at [address] Charna Archer, LPN

## 2016-04-29 ENCOUNTER — Other Ambulatory Visit: Payer: Self-pay | Admitting: Orthopedic Surgery

## 2016-05-04 ENCOUNTER — Ambulatory Visit: Payer: 59 | Admitting: Cardiology

## 2016-05-10 ENCOUNTER — Encounter: Payer: Self-pay | Admitting: Sports Medicine

## 2016-05-10 ENCOUNTER — Ambulatory Visit (INDEPENDENT_AMBULATORY_CARE_PROVIDER_SITE_OTHER): Payer: 59 | Admitting: Sports Medicine

## 2016-05-10 DIAGNOSIS — E7800 Pure hypercholesterolemia, unspecified: Secondary | ICD-10-CM

## 2016-05-10 DIAGNOSIS — M2550 Pain in unspecified joint: Secondary | ICD-10-CM | POA: Diagnosis not present

## 2016-05-10 DIAGNOSIS — M0579 Rheumatoid arthritis with rheumatoid factor of multiple sites without organ or systems involvement: Secondary | ICD-10-CM

## 2016-05-10 DIAGNOSIS — R972 Elevated prostate specific antigen [PSA]: Secondary | ICD-10-CM | POA: Diagnosis not present

## 2016-05-10 NOTE — Assessment & Plan Note (Signed)
Rechecking lipids, A1c

## 2016-05-10 NOTE — Progress Notes (Signed)
  Subjective:    CC: Polyarthralgia  HPI: Andre Cohen comes back in, for several months now he's had widespread joint aches and pains, moderate, persistent, fatigue, feels unable to do activities of daily living, he's having difficulty sleeping, poor appetite, he's lost his sex drive. Minimal depressed mood, anhedonia. No suicidal or homicidal ideation. He feels better when he takes meloxicam but only minimally so. He is here mostly wanting a large workup to evaluate any metabolic, inflammatory, infectious process.  Past medical history:  Negative.  See flowsheet/record as well for more information.  Surgical history: Negative.  See flowsheet/record as well for more information.  Family history: Negative.  See flowsheet/record as well for more information.  Social history: Negative.  See flowsheet/record as well for more information.  Allergies, and medications have been entered into the medical record, reviewed, and no changes needed.   Review of Systems: No fevers, chills, night sweats, weight loss, chest pain, or shortness of breath.   Objective:    General: Well Developed, well nourished, and in no acute distress.  Neuro: Alert and oriented x3, extra-ocular muscles intact, sensation grossly intact.  HEENT: Normocephalic, atraumatic, pupils equal round reactive to light, neck supple, no masses, no lymphadenopathy, thyroid nonpalpable.  Skin: Warm and dry, no rashes. Cardiac: Regular rate and rhythm, no murmurs rubs or gallops, no lower extremity edema.  Respiratory: Clear to auscultation bilaterally. Not using accessory muscles, speaking in full sentences.  Impression and Recommendations:    Polyarthralgia Doing a large workup. Also considering myofascial pain syndrome and depression. If serum and urine workup is negative we will consider treatment with SNRI.  Hyperlipidemia Rechecking lipids, A1c  I spent 40 minutes with this patient, greater than 50% was face-to-face time counseling  regarding the above diagnoses

## 2016-05-10 NOTE — Assessment & Plan Note (Addendum)
Doing a large workup. Also considering myofascial pain syndrome and depression. If serum and urine workup is negative we will consider treatment with SNRI.  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

## 2016-05-11 LAB — CK: Total CK: 189 U/L (ref 7–232)

## 2016-05-11 LAB — CBC WITH DIFFERENTIAL/PLATELET
Basophils Absolute: 64 cells/uL (ref 0–200)
Basophils Relative: 1 %
Eosinophils Absolute: 64 cells/uL (ref 15–500)
Eosinophils Relative: 1 %
HCT: 41.6 % (ref 38.5–50.0)
Hemoglobin: 14 g/dL (ref 13.2–17.1)
Lymphocytes Relative: 33 %
Lymphs Abs: 2112 cells/uL (ref 850–3900)
MCH: 28.5 pg (ref 27.0–33.0)
MCHC: 33.7 g/dL (ref 32.0–36.0)
MCV: 84.6 fL (ref 80.0–100.0)
MPV: 10.6 fL (ref 7.5–12.5)
Monocytes Absolute: 512 cells/uL (ref 200–950)
Monocytes Relative: 8 %
Neutro Abs: 3648 cells/uL (ref 1500–7800)
Neutrophils Relative %: 57 %
Platelets: 414 10*3/uL — ABNORMAL HIGH (ref 140–400)
RBC: 4.92 MIL/uL (ref 4.20–5.80)
RDW: 13.8 % (ref 11.0–15.0)
WBC: 6.4 10*3/uL (ref 3.8–10.8)

## 2016-05-11 LAB — COMPREHENSIVE METABOLIC PANEL
ALT: 18 U/L (ref 9–46)
AST: 19 U/L (ref 10–40)
Albumin: 4.3 g/dL (ref 3.6–5.1)
Alkaline Phosphatase: 85 U/L (ref 40–115)
BUN: 12 mg/dL (ref 7–25)
CO2: 25 mmol/L (ref 20–31)
Calcium: 10 mg/dL (ref 8.6–10.3)
Chloride: 103 mmol/L (ref 98–110)
Creat: 0.7 mg/dL (ref 0.60–1.35)
Glucose, Bld: 90 mg/dL (ref 65–99)
Potassium: 4.5 mmol/L (ref 3.5–5.3)
Sodium: 140 mmol/L (ref 135–146)
Total Bilirubin: 0.5 mg/dL (ref 0.2–1.2)
Total Protein: 7.4 g/dL (ref 6.1–8.1)

## 2016-05-11 LAB — URINALYSIS
Bilirubin Urine: NEGATIVE
Glucose, UA: NEGATIVE
Hgb urine dipstick: NEGATIVE
Ketones, ur: NEGATIVE
Leukocytes, UA: NEGATIVE
Nitrite: NEGATIVE
Protein, ur: NEGATIVE
Specific Gravity, Urine: 1.014 (ref 1.001–1.035)
pH: 5.5 (ref 5.0–8.0)

## 2016-05-11 LAB — LYME AB/WESTERN BLOT REFLEX: B burgdorferi Ab IgG+IgM: <0.9 Index (ref <0.90)

## 2016-05-11 LAB — SEDIMENTATION RATE: Sed Rate: 30 mm/hr — ABNORMAL HIGH (ref 0–15)

## 2016-05-11 LAB — LIPID PANEL
Cholesterol: 167 mg/dL (ref <200)
HDL: 40 mg/dL — ABNORMAL LOW (ref >40)
LDL Cholesterol: 117 mg/dL — ABNORMAL HIGH (ref <100)
Total CHOL/HDL Ratio: 4.2 Ratio (ref <5.0)
Triglycerides: 52 mg/dL (ref <150)
VLDL: 10 mg/dL (ref <30)

## 2016-05-11 LAB — PSA, TOTAL AND FREE
PSA, % Free: 9 % — ABNORMAL LOW (ref >25)
PSA, Free: 0.7 ng/mL
PSA, Total: 7.8 ng/mL — ABNORMAL HIGH (ref <=4.0)

## 2016-05-11 LAB — CYCLIC CITRUL PEPTIDE ANTIBODY, IGG: Cyclic Citrullin Peptide Ab: >250 Units — ABNORMAL HIGH

## 2016-05-11 LAB — URIC ACID: Uric Acid, Serum: 7.5 mg/dL (ref 4.0–8.0)

## 2016-05-11 LAB — TSH: TSH: 1.67 mIU/L (ref 0.40–4.50)

## 2016-05-11 LAB — TESTOSTERONE TOTAL,FREE,BIO, MALES
Albumin: 4.3 g/dL (ref 3.6–5.1)
Sex Hormone Binding: 39 nmol/L (ref 10–50)
Testosterone, Bioavailable: 100.1 ng/dL — ABNORMAL LOW (ref 110.0–575.0)
Testosterone, Free: 50.8 pg/mL (ref 46.0–224.0)
Testosterone: 437 ng/dL (ref 250–827)

## 2016-05-11 LAB — HEMOGLOBIN A1C
Hgb A1c MFr Bld: 5.7 % — ABNORMAL HIGH (ref <5.7)
Mean Plasma Glucose: 117 mg/dL

## 2016-05-11 LAB — RHEUMATOID FACTOR: Rheumatoid fact SerPl-aCnc: 74 [IU]/mL — ABNORMAL HIGH (ref <14)

## 2016-05-11 LAB — VITAMIN D 25 HYDROXY (VIT D DEFICIENCY, FRACTURES): Vit D, 25-Hydroxy: 35 ng/mL (ref 30–100)

## 2016-05-11 LAB — ANA: Anti Nuclear Antibody(ANA): NEGATIVE

## 2016-05-11 NOTE — Addendum Note (Signed)
Addended by: Silverio Decamp on: 05/11/2016 01:18 PM   Modules accepted: Orders

## 2016-05-12 LAB — URINE CULTURE: Organism ID, Bacteria: NO GROWTH

## 2016-05-13 ENCOUNTER — Encounter: Payer: Self-pay | Admitting: Sports Medicine

## 2016-05-13 DIAGNOSIS — M059 Rheumatoid arthritis with rheumatoid factor, unspecified: Secondary | ICD-10-CM

## 2016-05-13 LAB — ROCKY MTN SPOTTED FVR ABS PNL(IGG+IGM)
RMSF IgG: NOT DETECTED
RMSF IgM: NOT DETECTED

## 2016-05-14 LAB — EHRLICHIA ANTIBODY PANEL
E chaffeensis (HGE) Ab, IgG: 1:128 {titer} — ABNORMAL HIGH
E chaffeensis (HGE) Ab, IgM: <1:20 {titer}

## 2016-05-16 ENCOUNTER — Telehealth: Payer: Self-pay

## 2016-05-16 DIAGNOSIS — R972 Elevated prostate specific antigen [PSA]: Secondary | ICD-10-CM | POA: Insufficient documentation

## 2016-05-16 MED ORDER — PREDNISONE 10 MG PO TABS: 10.0000 mg | ORAL_TABLET | Freq: Every day | ORAL | 3 refills | Status: DC

## 2016-05-16 NOTE — Assessment & Plan Note (Signed)
Recheck in May 2018

## 2016-05-16 NOTE — Telephone Encounter (Signed)
Let us simply start with prednisone 10 mg daily, they will probably switch him to methotrexate or a biologic.

## 2016-05-16 NOTE — Telephone Encounter (Signed)
Pt wife called stating that her husband is in pain due to inflammation. They are wanting to know what could he take and or do to help with this while waiting on his appointment to see Dr. Amil Amen in January. He is not asking for any pain meds. Please advise.

## 2016-05-17 NOTE — Telephone Encounter (Signed)
Pt.notified

## 2016-05-18 LAB — CULTURE, BLOOD (SINGLE): Organism ID, Bacteria: NO GROWTH

## 2016-05-19 LAB — HLA-B27 ANTIGEN: DNA Result:: NEGATIVE

## 2016-05-23 DIAGNOSIS — M2550 Pain in unspecified joint: Secondary | ICD-10-CM | POA: Diagnosis not present

## 2016-05-23 DIAGNOSIS — R5383 Other fatigue: Secondary | ICD-10-CM | POA: Diagnosis not present

## 2016-05-23 DIAGNOSIS — R768 Other specified abnormal immunological findings in serum: Secondary | ICD-10-CM | POA: Diagnosis not present

## 2016-05-23 DIAGNOSIS — Z6830 Body mass index (BMI) 30.0-30.9, adult: Secondary | ICD-10-CM | POA: Diagnosis not present

## 2016-05-23 DIAGNOSIS — E669 Obesity, unspecified: Secondary | ICD-10-CM | POA: Diagnosis not present

## 2016-05-23 DIAGNOSIS — M0579 Rheumatoid arthritis with rheumatoid factor of multiple sites without organ or systems involvement: Secondary | ICD-10-CM | POA: Diagnosis not present

## 2016-05-23 DIAGNOSIS — R7989 Other specified abnormal findings of blood chemistry: Secondary | ICD-10-CM | POA: Diagnosis not present

## 2016-05-24 ENCOUNTER — Ambulatory Visit: Payer: 59 | Admitting: Sports Medicine

## 2016-05-26 MED FILL — predniSONE 10 MG TABS: 10 | 30 days supply | Qty: 30 | Fill #0

## 2016-06-01 MED FILL — HUMIRA PEN 40 MG/0.8ML PNKT: 40 | 28 days supply | Qty: 2 | Fill #0

## 2016-06-17 ENCOUNTER — Encounter: Payer: Self-pay | Admitting: Emergency Medicine

## 2016-06-17 ENCOUNTER — Emergency Department
Admission: EM | Admit: 2016-06-17 | Discharge: 2016-06-17 | Disposition: A | Discharge status: Home / Self Care | Payer: 59 | Source: Home / Self Care | Attending: Family Medicine | Admitting: Family Medicine

## 2016-06-17 DIAGNOSIS — J069 Acute upper respiratory infection, unspecified: Secondary | ICD-10-CM

## 2016-06-17 DIAGNOSIS — R062 Wheezing: Secondary | ICD-10-CM

## 2016-06-17 MED ORDER — AZITHROMYCIN 250 MG PO TABS
250.0000 mg | ORAL_TABLET | Freq: Every day | ORAL | 0 refills | Status: DC
Start: 2016-06-17 — End: 2016-07-14

## 2016-06-17 MED ORDER — BENZONATATE 100 MG PO CAPS: 100.0000 mg | ORAL_CAPSULE | Freq: Three times a day (TID) | ORAL | 0 refills | Status: DC

## 2016-06-17 MED ORDER — IPRATROPIUM BROMIDE 0.06 % NA SOLN: 2.0000 | Freq: Four times a day (QID) | NASAL | 1 refills | Status: AC

## 2016-06-17 NOTE — ED Provider Notes (Signed)
CSN: [id number]     Arrival date & time 06/17/16  1126 History   First MD Initiated Contact with Patient 06/17/16 1226     Chief Complaint  Patient presents with  . Cough   (Consider location/radiation/quality/duration/timing/severity/associated sxs/prior Treatment) HPI  Andre Cohen is a 45 y.o. male presenting to UC with c/o 1-2 weeks of gradually worsening cough with white sputum and post-nasal drip.  He reports getting similar symptoms around the same time every year, which turns into bronchitis.  He has been using an albuterol inhale twice daily with mild temporary relief. He notes he was recently dx with RA and started on [place] about 6-7 weeks ago.  He was advised to be evaluated immediately if any concern for infection so pt did not want to give URI symptoms another week as he believes they would likely worsen.  Denies fever, chills, n/v/d.    Past Medical History:  Diagnosis Date  . Asthma   . Carpal tunnel syndrome on both sides   . LFT elevation    VITAMIN USE IN THE PAST  . Sleep apnea    wear CPAP nightly   Past Surgical History:  Procedure Laterality Date  . CARPAL TUNNEL RELEASE Right 04/15/2016   Procedure: CARPAL TUNNEL RELEASE, right;  Surgeon: Daryll Brod, MD;  Location: [place];  Service: Orthopedics;  Laterality: Right;  FAB  . WISDOM TOOTH EXTRACTION     Family History  Problem Relation Age of Onset  . Hypertension Mother   . Hypertension Father   . Hypertension Sister   . Hypertension Brother   . Cancer Brother     skin   Social History  Substance Use Topics  . Smoking status: Never Smoker  . Smokeless tobacco: Never Used  . Alcohol use Yes     Comment: social    Review of Systems  Constitutional: Negative for chills and fever.  HENT: Positive for congestion, postnasal drip and rhinorrhea. Negative for ear pain, sinus pain, sore throat, trouble swallowing and voice change.   Respiratory: Positive for cough, chest  tightness and wheezing. Negative for shortness of breath.   Cardiovascular: Negative for chest pain and palpitations.  Gastrointestinal: Negative for abdominal pain, diarrhea, nausea and vomiting.  Musculoskeletal: Negative for arthralgias, back pain and myalgias.  Skin: Negative for rash.    Allergies  Doxycycline  Home Medications   Prior to Admission medications   Medication Sig Start Date End Date Taking? Authorizing Provider  albuterol (PROVENTIL HFA;VENTOLIN HFA) 108 (90 BASE) MCG/ACT inhaler Inhale 2 puffs into the lungs every 6 (six) hours as needed for wheezing or shortness of breath. 03/31/15   Emeterio Reeve, DO  AMBULATORY NON FORMULARY MEDICATION Continuous positive airway pressure (CPAP) machine on auto pressure titration, if unable the set at 12 cm of H2O pressure, with all supplemental supplies as needed. 06/05/15   Silverio Decamp, MD  azithromycin (ZITHROMAX) 250 MG tablet Take 1 tablet (250 mg total) by mouth daily. Take first 2 tablets together, then 1 every day until finished. 06/17/16   Noland Fordyce, PA-C  benzonatate (TESSALON) 100 MG capsule Take 1-2 capsules (100-200 mg total) by mouth every 8 (eight) hours. 06/17/16   Noland Fordyce, PA-C  ipratropium (ATROVENT) 0.06 % nasal spray Place 2 sprays into both nostrils 4 (four) times daily. For 1 week 06/17/16   Noland Fordyce, PA-C  meloxicam (MOBIC) 15 MG tablet One tab PO qAM with breakfast for 2 weeks, then daily prn pain. 12/30/15  Silverio Decamp, MD  predniSONE (DELTASONE) 10 MG tablet Take 1 tablet (10 mg total) by mouth daily with breakfast. 05/16/16   Silverio Decamp, MD  valACYclovir (VALTREX) 1000 MG tablet Take 1 tablet (1,000 mg total) by mouth 2 (two) times daily. 12/30/15   Silverio Decamp, MD   Meds Ordered and Administered this Visit  Medications - No data to display  BP 134/87 (BP Location: Right Arm)   Pulse 70   Temp 97.8 F (36.6 C) (Oral)   Wt 216 lb (98 kg)   SpO2  96%   BMI 29.29 kg/m  No data found.   Physical Exam  Constitutional: He appears well-developed and well-nourished. No distress.  HENT:  Head: Normocephalic and atraumatic.  Right Ear: Tympanic membrane normal.  Left Ear: Tympanic membrane normal.  Nose: Nose normal.  Mouth/Throat: Uvula is midline, oropharynx is clear and moist and mucous membranes are normal.  Eyes: Conjunctivae are normal. No scleral icterus.  Neck: Normal range of motion. Neck supple.  Cardiovascular: Normal rate, regular rhythm and normal heart sounds.   Pulmonary/Chest: Effort normal. No stridor. No respiratory distress. He has wheezes ( faint diffuse expiratory wheeze). He has no rales. He exhibits no tenderness.  No respiratory distress.  Musculoskeletal: Normal range of motion.  Lymphadenopathy:    He has no cervical adenopathy.  Neurological: He is alert.  Skin: Skin is warm and dry. He is not diaphoretic.  Nursing note and vitals reviewed.   Urgent Care Course   Clinical Course     Procedures (including critical care time)  Labs Review Labs Reviewed - No data to display  Imaging Review No results found.   MDM   1. Upper respiratory tract infection, unspecified type   2. Wheeze    Pt c/o 1-2 weeks of worsening URI symptoms. Hx of bronchitis in the past. Pt currently on [place] as well as 5mg  prednisone daily.  Will cover for bacterial infection. Rx: Azithromycin, ipratropium nasal spray, refill of albuterol inhaler and tessalon F/u with PCP in 1 week if not improving. Patient verbalized understanding and agreement with treatment plan.    Noland Fordyce, PA-C 06/17/16 1310

## 2016-06-17 NOTE — ED Triage Notes (Signed)
Pt c/o cough with mucous and post nasal drip x1 week. He has recently started humira for RA treatment. Hx of asthma and no relief with inhaler use.

## 2016-06-21 ENCOUNTER — Encounter: Payer: Self-pay | Admitting: Pharmacist

## 2016-06-21 NOTE — Progress Notes (Deleted)
error 

## 2016-06-22 MED FILL — valACYclovir HCL 1 GM TABS: 1 | 7 days supply | Qty: 14 | Fill #1

## 2016-06-24 ENCOUNTER — Ambulatory Visit (HOSPITAL_BASED_OUTPATIENT_CLINIC_OR_DEPARTMENT_OTHER): Payer: Self-pay | Admitting: Pharmacist

## 2016-06-24 DIAGNOSIS — Z79899 Other long term (current) drug therapy: Secondary | ICD-10-CM

## 2016-06-24 MED ORDER — ADALIMUMAB 40 MG/0.8ML ~~LOC~~ AJKT: 1.0000 "pen " | AUTO-INJECTOR | SUBCUTANEOUS | 4 refills | Status: DC

## 2016-06-24 NOTE — Progress Notes (Signed)
   S: Patient presents today to the [facility].  Patient is currently taking Humira for rheumatoid arthritis. Patient is managed by Dr. Amil Amen and Marella Chimes PA-C for this.   Adherence: denies any missed doses. Will miss today's dose due to recent infection but will take it 7 days after the completion of an antibiotic which will be on Tuesday.  Efficacy: Just started in December 2017 and feels much better already.   Dosing:  Rheumatoid arthritis: SubQ: 40 mg every other week (may continue methotrexate, other nonbiologic DMARDS, corticosteroids, NSAIDs, and/or analgesics); patients not taking concomitant methotrexate may increase dose to 40 mg every week  Drug-drug interactions: none  Screening: TB test: completed 1 year ago Hepatitis: screening completed per patient, history of elevated LFTs  Monitoring: S/sx of infection: denies CBC: completed 11/17 - WNL but PLT 414 S/sx of hypersensitivity: denies S/sx of malignancy: denies S/sx of heart failure: denies  O:     Lab Results  Component Value Date   WBC 6.4 05/10/2016   HGB 14.0 05/10/2016   HCT 41.6 05/10/2016   MCV 84.6 05/10/2016   PLT 414 (H) 05/10/2016      Chemistry      Component Value Date/Time   NA 140 05/10/2016 1535   K 4.5 05/10/2016 1535   CL 103 05/10/2016 1535   CO2 25 05/10/2016 1535   BUN 12 05/10/2016 1535   CREATININE 0.70 05/10/2016 1535      Component Value Date/Time   CALCIUM 10.0 05/10/2016 1535   ALKPHOS 85 05/10/2016 1535   AST 19 05/10/2016 1535   ALT 18 05/10/2016 1535   BILITOT 0.5 05/10/2016 1535       A/P: 1. Medication review: Patient currently on Humira for rheumatoid arthritis and is tolerating the medication well with no adverse effects.. Reviewed the medication with the patient, including the following: Humira is a TNF blocking agent indicated for ankylosing spondylitis, Crohn's disease, Hidradenitis suppurativa, psoriatic  arthritis, plaque psoriasis, ulcerative colitis, and uveitis. The most common adverse effects are infections, headache, and injection site reactions. There is the possibility of an increased risk of malignancy but it is not well understood if this increased risk is due to there medication or the disease state. There are rare cases of pancytopenia and aplastic anemia. There are no dose adjustments for Humira and hepatic impairment because it was not studied in hepatic impairment. No recommendations for any changes.    Nicoletta Ba, PharmD, BCPS, BCACP, [facility] [phone]

## 2016-06-28 MED FILL — HUMIRA PEN 40 MG/0.8ML PNKT: 40 | 28 days supply | Qty: 2 | Fill #0

## 2016-07-07 ENCOUNTER — Ambulatory Visit: Payer: Self-pay | Admitting: Sports Medicine

## 2016-07-07 MED FILL — MELOXICAM 15 MG TABLET: 15 | 90 days supply | Qty: 90 | Fill #1

## 2016-07-07 MED FILL — valACYclovir HCL 1 GM TABS: 1 | 7 days supply | Qty: 14 | Fill #2

## 2016-07-14 ENCOUNTER — Encounter: Payer: Self-pay | Admitting: Sports Medicine

## 2016-07-14 ENCOUNTER — Ambulatory Visit (INDEPENDENT_AMBULATORY_CARE_PROVIDER_SITE_OTHER): Payer: 59 | Admitting: Sports Medicine

## 2016-07-14 VITALS — BP 120/76 | HR 73 | Resp 18 | Wt 222.0 lb

## 2016-07-14 DIAGNOSIS — Z0000 Encounter for general adult medical examination without abnormal findings: Secondary | ICD-10-CM

## 2016-07-14 DIAGNOSIS — Z23 Encounter for immunization: Secondary | ICD-10-CM

## 2016-07-14 DIAGNOSIS — M0579 Rheumatoid arthritis with rheumatoid factor of multiple sites without organ or systems involvement: Secondary | ICD-10-CM | POA: Diagnosis not present

## 2016-07-14 DIAGNOSIS — K582 Mixed irritable bowel syndrome: Secondary | ICD-10-CM

## 2016-07-14 DIAGNOSIS — K589 Irritable bowel syndrome without diarrhea: Secondary | ICD-10-CM | POA: Insufficient documentation

## 2016-07-14 MED ORDER — VALACYCLOVIR HCL 1 G PO TABS: 1000.0000 mg | ORAL_TABLET | Freq: Two times a day (BID) | ORAL | 2 refills | Status: DC

## 2016-07-14 MED ORDER — CALCIUM CARBONATE-VITAMIN D 500-200 MG-UNIT PO TABS: 1.0000 | ORAL_TABLET | Freq: Two times a day (BID) | ORAL | 3 refills | Status: DC

## 2016-07-14 MED ORDER — PSYLLIUM 0.52 G PO CAPS: ORAL_CAPSULE | ORAL | 3 refills | Status: DC

## 2016-07-14 MED FILL — valACYclovir HCL 1 GM TABS: 1 | 7 days supply | Qty: 14 | Fill #0

## 2016-07-14 NOTE — Progress Notes (Signed)
  Subjective:    CC: Follow-up  HPI: This is a pleasant 46 year old male, we diagnosed him with rheumatoid arthritis, seropositive with a positive CCP and rheumatoid factor the last visit, since then he's been placed on daily prednisone and Humira and he is completely symptom free.  GI symptoms: Gets occasional episodes 2-3 times per year where he will start feel nauseated, have a cold sweat, and then a sudden urge to stool. He will have a large explosive diarrhea and then symptoms will be gone.  Past medical history:  Negative.  See flowsheet/record as well for more information.  Surgical history: Negative.  See flowsheet/record as well for more information.  Family history: Negative.  See flowsheet/record as well for more information.  Social history: Negative.  See flowsheet/record as well for more information.  Allergies, and medications have been entered into the medical record, reviewed, and no changes needed.   Review of Systems: No fevers, chills, night sweats, weight loss, chest pain, or shortness of breath.   Objective:    General: Well Developed, well nourished, and in no acute distress.  Neuro: Alert and oriented x3, extra-ocular muscles intact, sensation grossly intact.  HEENT: Normocephalic, atraumatic, pupils equal round reactive to light, neck supple, no masses, no lymphadenopathy, thyroid nonpalpable.  Skin: Warm and dry, no rashes. Cardiac: Regular rate and rhythm, no murmurs rubs or gallops, no lower extremity edema.  Respiratory: Clear to auscultation bilaterally. Not using accessory muscles, speaking in full sentences.  Impression and Recommendations:    Rheumatoid arthritis, seropositive, multiple sites ([place]) Currently on Humira and prednisone daily. Symptoms are extremely well controlled. He will need pneumococcal vaccination, as well as DEXA scans every 2 years since he is on daily prednisone.   IBS (irritable bowel syndrome) Adding extra fiber, he can also  do Levsin if inadequate response.  I spent 25 minutes with this patient, greater than 50% was face-to-face time counseling regarding the above diagnoses

## 2016-07-14 NOTE — Assessment & Plan Note (Signed)
Adding extra fiber, he can also do Levsin if inadequate response.

## 2016-07-14 NOTE — Assessment & Plan Note (Signed)
Currently on Humira and prednisone daily. Symptoms are extremely well controlled. He will need pneumococcal vaccination, as well as DEXA scans every 2 years since he is on daily prednisone.

## 2016-07-20 MED FILL — predniSONE 10 MG TABS: 10 | 30 days supply | Qty: 30 | Fill #1

## 2016-07-25 DIAGNOSIS — Z6831 Body mass index (BMI) 31.0-31.9, adult: Secondary | ICD-10-CM | POA: Diagnosis not present

## 2016-07-25 DIAGNOSIS — R7989 Other specified abnormal findings of blood chemistry: Secondary | ICD-10-CM | POA: Diagnosis not present

## 2016-07-25 DIAGNOSIS — E669 Obesity, unspecified: Secondary | ICD-10-CM | POA: Diagnosis not present

## 2016-07-25 DIAGNOSIS — Z6830 Body mass index (BMI) 30.0-30.9, adult: Secondary | ICD-10-CM | POA: Diagnosis not present

## 2016-07-25 DIAGNOSIS — M2550 Pain in unspecified joint: Secondary | ICD-10-CM | POA: Diagnosis not present

## 2016-07-25 DIAGNOSIS — M0579 Rheumatoid arthritis with rheumatoid factor of multiple sites without organ or systems involvement: Secondary | ICD-10-CM | POA: Diagnosis not present

## 2016-07-26 ENCOUNTER — Ambulatory Visit (INDEPENDENT_AMBULATORY_CARE_PROVIDER_SITE_OTHER): Payer: 59

## 2016-07-26 DIAGNOSIS — M069 Rheumatoid arthritis, unspecified: Secondary | ICD-10-CM | POA: Diagnosis not present

## 2016-07-26 DIAGNOSIS — Z7952 Long term (current) use of systemic steroids: Secondary | ICD-10-CM | POA: Diagnosis not present

## 2016-07-29 ENCOUNTER — Ambulatory Visit (HOSPITAL_BASED_OUTPATIENT_CLINIC_OR_DEPARTMENT_OTHER): Admit: 2016-07-29 | Payer: Self-pay | Admitting: Orthopedic Surgery

## 2016-07-29 ENCOUNTER — Encounter (HOSPITAL_BASED_OUTPATIENT_CLINIC_OR_DEPARTMENT_OTHER): Payer: Self-pay

## 2016-07-29 SURGERY — CARPAL TUNNEL RELEASE
Anesthesia: Regional | Laterality: Left

## 2016-08-08 MED FILL — HUMIRA PEN 40 MG/0.8ML PNKT: 40 | 28 days supply | Qty: 2 | Fill #1

## 2016-09-21 MED FILL — MELOXICAM 15 MG TABLET: 15 | 90 days supply | Qty: 90 | Fill #2

## 2016-09-21 MED FILL — HUMIRA PEN 40 MG/0.8ML PNKT: 40 | 28 days supply | Qty: 2 | Fill #2

## 2016-10-19 MED FILL — HUMIRA PEN 40 MG/0.8ML PNKT: 40 | 28 days supply | Qty: 2 | Fill #3

## 2016-11-09 MED FILL — HUMIRA PEN 40 MG/0.8ML PNKT: 40 | 28 days supply | Qty: 2 | Fill #4

## 2016-11-10 MED FILL — predniSONE 5 MG TABS: 5 | 30 days supply | Qty: 30 | Fill #0

## 2016-11-22 DIAGNOSIS — R7989 Other specified abnormal findings of blood chemistry: Secondary | ICD-10-CM | POA: Diagnosis not present

## 2016-11-22 DIAGNOSIS — E669 Obesity, unspecified: Secondary | ICD-10-CM | POA: Diagnosis not present

## 2016-11-22 DIAGNOSIS — Z6831 Body mass index (BMI) 31.0-31.9, adult: Secondary | ICD-10-CM | POA: Diagnosis not present

## 2016-11-22 DIAGNOSIS — M0579 Rheumatoid arthritis with rheumatoid factor of multiple sites without organ or systems involvement: Secondary | ICD-10-CM | POA: Diagnosis not present

## 2016-11-22 DIAGNOSIS — M2550 Pain in unspecified joint: Secondary | ICD-10-CM | POA: Diagnosis not present

## 2016-11-22 DIAGNOSIS — R635 Abnormal weight gain: Secondary | ICD-10-CM | POA: Diagnosis not present

## 2016-12-07 ENCOUNTER — Other Ambulatory Visit: Payer: Self-pay | Admitting: Internal Medicine

## 2016-12-08 ENCOUNTER — Other Ambulatory Visit: Payer: Self-pay | Admitting: Pharmacist

## 2016-12-08 MED ORDER — ADALIMUMAB 40 MG/0.8ML ~~LOC~~ AJKT: 1.0000 "pen " | AUTO-INJECTOR | SUBCUTANEOUS | 5 refills | Status: DC

## 2016-12-08 MED FILL — HUMIRA PEN 40 MG/0.8ML PNKT: 40 | 28 days supply | Qty: 2 | Fill #0

## 2017-01-12 MED FILL — HUMIRA PEN 40 MG/0.8ML PNKT: 40 | 28 days supply | Qty: 2 | Fill #1

## 2017-01-20 ENCOUNTER — Other Ambulatory Visit: Payer: Self-pay | Admitting: Sports Medicine

## 2017-01-20 DIAGNOSIS — M25511 Pain in right shoulder: Secondary | ICD-10-CM

## 2017-01-20 MED FILL — MELOXICAM 15 MG TABLET: 15 | 90 days supply | Qty: 90 | Fill #0

## 2017-02-01 DIAGNOSIS — G4733 Obstructive sleep apnea (adult) (pediatric): Secondary | ICD-10-CM | POA: Diagnosis not present

## 2017-02-06 MED FILL — valACYclovir HCL 1 GM TABS: 1 | 7 days supply | Qty: 14 | Fill #1

## 2017-02-17 MED FILL — HUMIRA PEN 40 MG/0.8ML PNKT: 40 | 28 days supply | Qty: 2 | Fill #0 | Status: TO

## 2017-02-22 ENCOUNTER — Encounter: Payer: Self-pay | Admitting: Sports Medicine

## 2017-02-22 DIAGNOSIS — Z0000 Encounter for general adult medical examination without abnormal findings: Secondary | ICD-10-CM

## 2017-02-22 DIAGNOSIS — E291 Testicular hypofunction: Secondary | ICD-10-CM

## 2017-02-22 DIAGNOSIS — M1A9XX0 Chronic gout, unspecified, without tophus (tophi): Secondary | ICD-10-CM

## 2017-02-23 ENCOUNTER — Other Ambulatory Visit: Payer: Self-pay

## 2017-02-23 DIAGNOSIS — Z0000 Encounter for general adult medical examination without abnormal findings: Secondary | ICD-10-CM

## 2017-02-23 DIAGNOSIS — M0579 Rheumatoid arthritis with rheumatoid factor of multiple sites without organ or systems involvement: Secondary | ICD-10-CM

## 2017-02-23 DIAGNOSIS — R7303 Prediabetes: Secondary | ICD-10-CM

## 2017-02-23 DIAGNOSIS — E7849 Other hyperlipidemia: Secondary | ICD-10-CM

## 2017-02-23 DIAGNOSIS — R972 Elevated prostate specific antigen [PSA]: Secondary | ICD-10-CM

## 2017-02-23 DIAGNOSIS — E559 Vitamin D deficiency, unspecified: Secondary | ICD-10-CM

## 2017-02-24 LAB — CBC WITH DIFFERENTIAL/PLATELET
Basophils Absolute: 32 cells/uL (ref 0–200)
Basophils Relative: 0.6 %
Eosinophils Absolute: 49 cells/uL (ref 15–500)
Eosinophils Relative: 0.9 %
HCT: 42.6 % (ref 38.5–50.0)
Hemoglobin: 14.4 g/dL (ref 13.2–17.1)
Lymphs Abs: 2921 cells/uL (ref 850–3900)
MCH: 29.3 pg (ref 27.0–33.0)
MCHC: 33.8 g/dL (ref 32.0–36.0)
MCV: 86.6 fL (ref 80.0–100.0)
MPV: 11.8 fL (ref 7.5–12.5)
Monocytes Relative: 8.4 %
Neutro Abs: 1944 cells/uL (ref 1500–7800)
Neutrophils Relative %: 36 %
Platelets: 285 10*3/uL (ref 140–400)
RBC: 4.92 10*6/uL (ref 4.20–5.80)
RDW: 12.6 % (ref 11.0–15.0)
Total Lymphocyte: 54.1 %
WBC mixed population: 454 cells/uL (ref 200–950)
WBC: 5.4 10*3/uL (ref 3.8–10.8)

## 2017-02-24 LAB — COMPLETE METABOLIC PANEL WITH GFR
AG Ratio: 1.9 (calc) (ref 1.0–2.5)
ALT: 42 U/L (ref 9–46)
AST: 29 U/L (ref 10–40)
Albumin: 4.7 g/dL (ref 3.6–5.1)
Alkaline phosphatase (APISO): 43 U/L (ref 40–115)
BUN: 17 mg/dL (ref 7–25)
CO2: 28 mmol/L (ref 20–32)
Calcium: 9.8 mg/dL (ref 8.6–10.3)
Chloride: 102 mmol/L (ref 98–110)
Creat: 0.95 mg/dL (ref 0.60–1.35)
GFR, Est African American: 109 mL/min/{1.73_m2} (ref >=60)
GFR, Est Non African American: 94 mL/min/{1.73_m2} (ref >=60)
Globulin: 2.5 g/dL (calc) (ref 1.9–3.7)
Glucose, Bld: 86 mg/dL (ref 65–99)
Potassium: 4.4 mmol/L (ref 3.5–5.3)
Sodium: 137 mmol/L (ref 135–146)
Total Bilirubin: 0.6 mg/dL (ref 0.2–1.2)
Total Protein: 7.2 g/dL (ref 6.1–8.1)

## 2017-02-24 LAB — HEMOGLOBIN A1C
Hgb A1c MFr Bld: 5.4 % of total Hgb (ref <5.7)
Mean Plasma Glucose: 108 (calc)
eAG (mmol/L): 6 (calc)

## 2017-02-24 LAB — TESTOSTERONE TOTAL,FREE,BIO, MALES
Albumin: 4.7 g/dL (ref 3.6–5.1)
Sex Hormone Binding: 51 nmol/L — ABNORMAL HIGH (ref 10–50)
Testosterone, Bioavailable: 129 ng/dL (ref >=110.0)
Testosterone, Free: 60.2 pg/mL (ref 46.0–224.0)
Testosterone: 637 ng/dL (ref 250–827)

## 2017-02-24 LAB — TSH: TSH: 1.17 mIU/L (ref 0.40–4.50)

## 2017-02-24 LAB — PSA, TOTAL AND FREE
PSA, % Free: 44 % (calc) (ref >25)
PSA, Free: 0.4 ng/mL
PSA, Total: 0.9 ng/mL (ref <=4.0)

## 2017-02-24 LAB — LIPID PANEL W/REFLEX DIRECT LDL
Cholesterol: 205 mg/dL — ABNORMAL HIGH (ref <200)
HDL: 44 mg/dL (ref >40)
LDL Cholesterol (Calc): 145 mg/dL (calc) — ABNORMAL HIGH
Non-HDL Cholesterol (Calc): 161 mg/dL (calc) — ABNORMAL HIGH (ref <130)
Total CHOL/HDL Ratio: 4.7 (calc) (ref <5.0)
Triglycerides: 65 mg/dL (ref <150)

## 2017-02-24 LAB — VITAMIN D 25 HYDROXY (VIT D DEFICIENCY, FRACTURES): Vit D, 25-Hydroxy: 35 ng/mL (ref 30–100)

## 2017-02-24 LAB — URIC ACID: Uric Acid, Serum: 9.2 mg/dL — ABNORMAL HIGH (ref 4.0–8.0)

## 2017-02-28 DIAGNOSIS — L570 Actinic keratosis: Secondary | ICD-10-CM | POA: Diagnosis not present

## 2017-02-28 DIAGNOSIS — L820 Inflamed seborrheic keratosis: Secondary | ICD-10-CM | POA: Diagnosis not present

## 2017-03-15 MED FILL — HUMIRA PEN 40 MG/0.8ML PNKT: 40 | 28 days supply | Qty: 2 | Fill #0

## 2017-03-27 DIAGNOSIS — Z23 Encounter for immunization: Secondary | ICD-10-CM | POA: Diagnosis not present

## 2017-03-27 DIAGNOSIS — Z6831 Body mass index (BMI) 31.0-31.9, adult: Secondary | ICD-10-CM | POA: Diagnosis not present

## 2017-03-27 DIAGNOSIS — M0579 Rheumatoid arthritis with rheumatoid factor of multiple sites without organ or systems involvement: Secondary | ICD-10-CM | POA: Diagnosis not present

## 2017-03-27 DIAGNOSIS — M2550 Pain in unspecified joint: Secondary | ICD-10-CM | POA: Diagnosis not present

## 2017-03-27 DIAGNOSIS — E669 Obesity, unspecified: Secondary | ICD-10-CM | POA: Diagnosis not present

## 2017-03-27 DIAGNOSIS — E790 Hyperuricemia without signs of inflammatory arthritis and tophaceous disease: Secondary | ICD-10-CM | POA: Diagnosis not present

## 2017-03-27 DIAGNOSIS — R7989 Other specified abnormal findings of blood chemistry: Secondary | ICD-10-CM | POA: Diagnosis not present

## 2017-04-11 IMAGING — CR DG SHOULDER 2+V*R*
3 series · 3 of 3 positions shown · non-contrast
Comparison: None.

CLINICAL DATA: Shoulder pain. No known injury. Initial evaluation.

EXAM:
RIGHT SHOULDER - 2+ VIEW

[shoulder grashey]
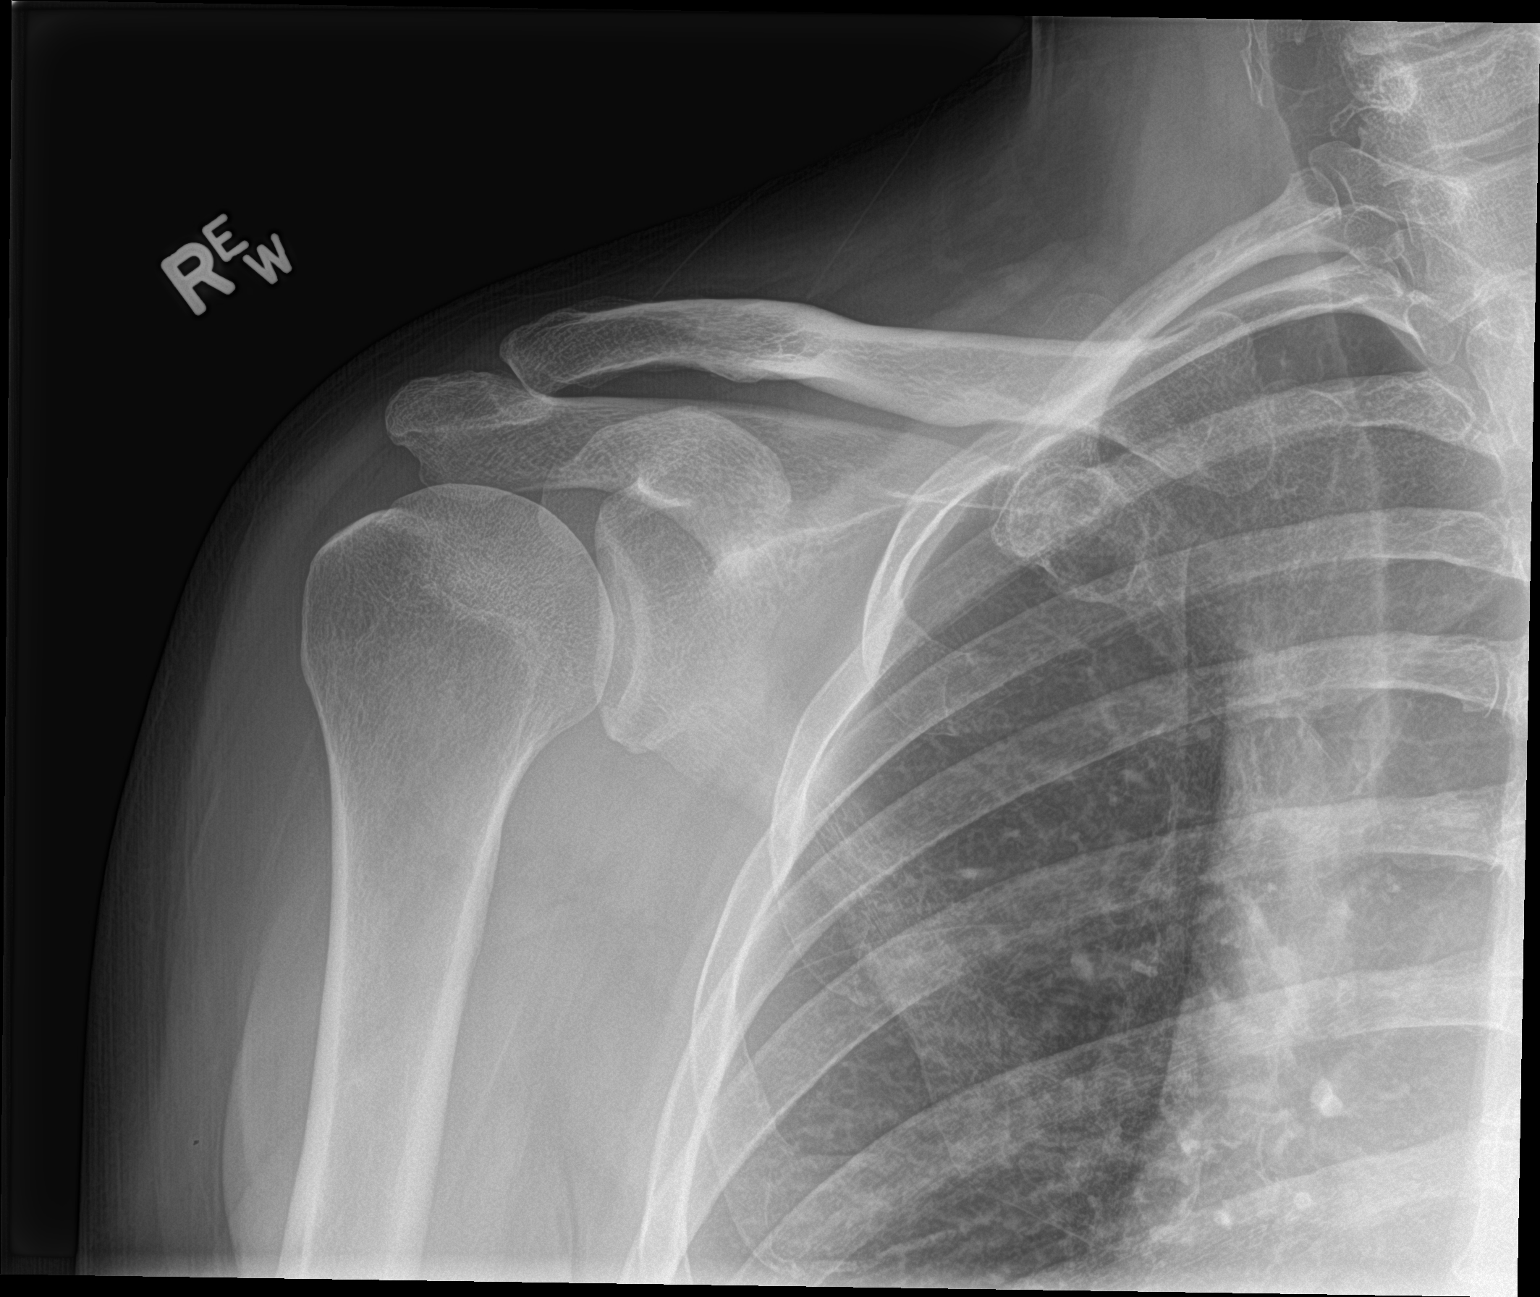

[shoulder y view]
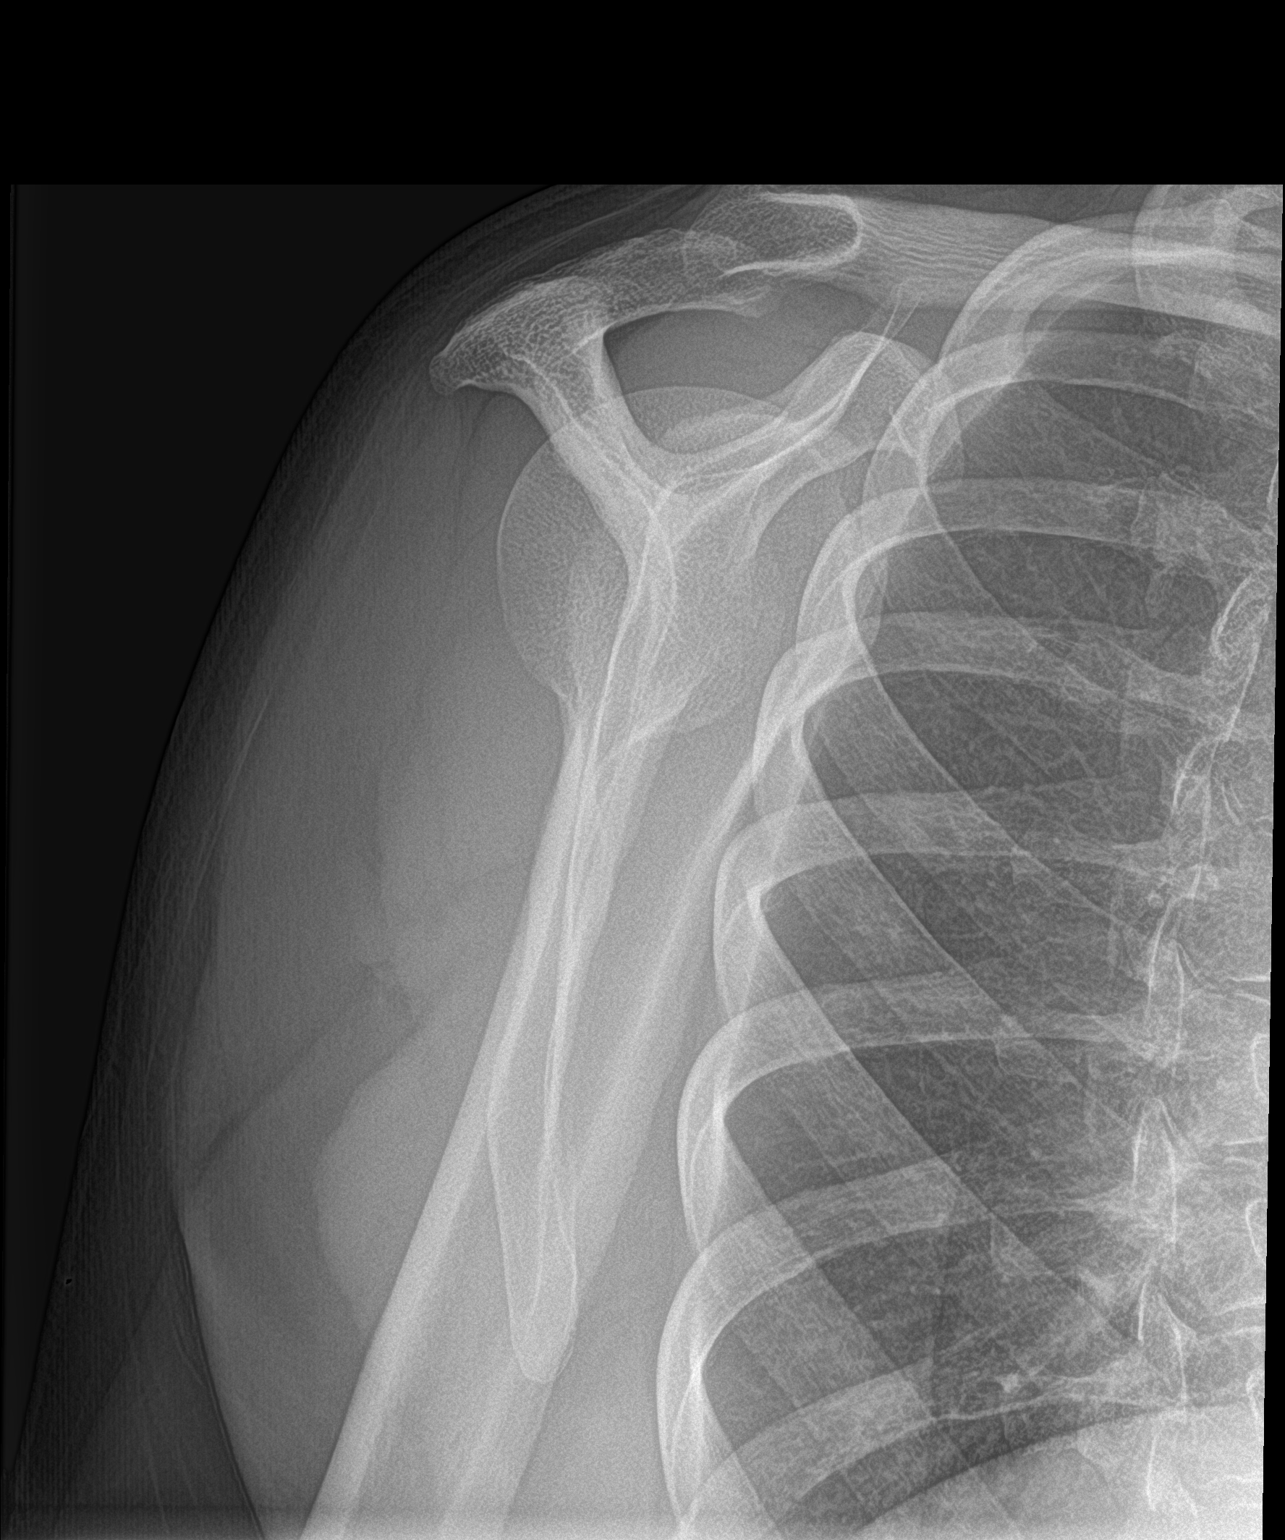

[shoulder axillary]
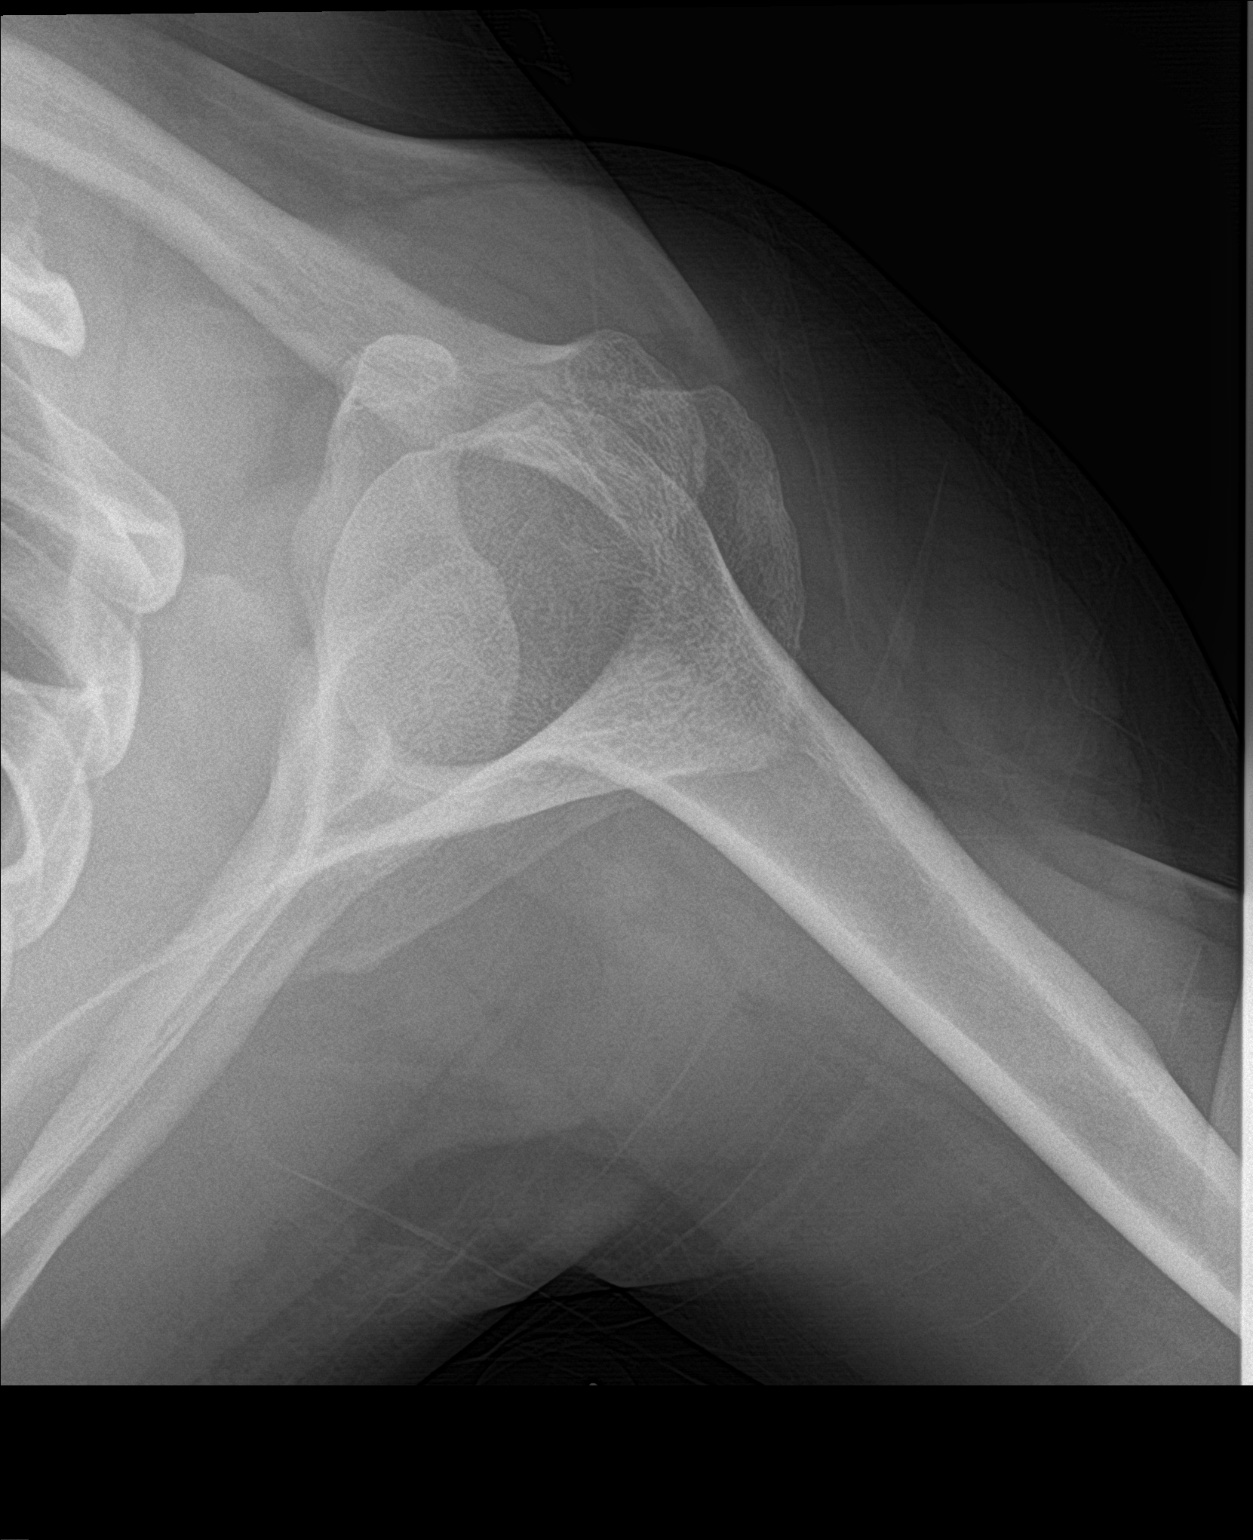

[3 of 3 positions shown; findings below may reference images not displayed]

FINDINGS: No acute bony or joint abnormality identified. No evidence of
fracture or dislocation
IMPRESSION: No acute abnormality.

## 2017-04-17 MED FILL — HUMIRA PEN 40 MG/0.8ML PNKT: 40 | 28 days supply | Qty: 2 | Fill #1

## 2017-04-24 MED FILL — MELOXICAM 15 MG TABLET: 15 | 90 days supply | Qty: 90 | Fill #1

## 2017-05-15 MED FILL — HUMIRA PEN 40 MG/0.8ML PNKT: 40 | 28 days supply | Qty: 2 | Fill #2

## 2017-06-07 ENCOUNTER — Other Ambulatory Visit: Payer: Self-pay | Admitting: Internal Medicine

## 2017-06-08 ENCOUNTER — Other Ambulatory Visit: Payer: Self-pay | Admitting: Pharmacist

## 2017-06-08 MED ORDER — ADALIMUMAB 40 MG/0.8ML ~~LOC~~ AJKT: 1.0000 "pen " | AUTO-INJECTOR | SUBCUTANEOUS | 5 refills | Status: DC

## 2017-06-08 MED FILL — HUMIRA PEN 40 MG/0.8ML PNKT: 40 | 28 days supply | Qty: 2 | Fill #0

## 2017-06-30 MED FILL — HUMIRA PEN 40 MG/0.8ML PNKT: 40 | 28 days supply | Qty: 2 | Fill #1

## 2017-06-30 MED FILL — valACYclovir HCL 1 GM TABS: 1 | 7 days supply | Qty: 14 | Fill #2

## 2017-07-04 DIAGNOSIS — E669 Obesity, unspecified: Secondary | ICD-10-CM | POA: Diagnosis not present

## 2017-07-04 DIAGNOSIS — E790 Hyperuricemia without signs of inflammatory arthritis and tophaceous disease: Secondary | ICD-10-CM | POA: Diagnosis not present

## 2017-07-04 DIAGNOSIS — R7989 Other specified abnormal findings of blood chemistry: Secondary | ICD-10-CM | POA: Diagnosis not present

## 2017-07-04 DIAGNOSIS — M2550 Pain in unspecified joint: Secondary | ICD-10-CM | POA: Diagnosis not present

## 2017-07-04 DIAGNOSIS — M0579 Rheumatoid arthritis with rheumatoid factor of multiple sites without organ or systems involvement: Secondary | ICD-10-CM | POA: Diagnosis not present

## 2017-07-04 DIAGNOSIS — Z6833 Body mass index (BMI) 33.0-33.9, adult: Secondary | ICD-10-CM | POA: Diagnosis not present

## 2017-07-04 MED FILL — NAPROXEN 500 MG TABLET: 500 | 30 days supply | Qty: 60 | Fill #0

## 2017-07-26 MED FILL — HUMIRA PEN 40 MG/0.8ML PNKT: 40 | 28 days supply | Qty: 2 | Fill #2

## 2017-07-26 MED FILL — OSELTAMIVIR PHOSPHATE 75 MG: 75 | 7 days supply | Qty: 7 | Fill #0

## 2017-08-23 MED FILL — NAPROXEN 500 MG TABLET: 500 | 30 days supply | Qty: 60 | Fill #1

## 2017-08-30 MED FILL — HUMIRA PEN 40 MG/0.8ML PNKT: 40 | 28 days supply | Qty: 2 | Fill #3

## 2017-09-22 MED FILL — HUMIRA PEN 40 MG/0.8ML PNKT: 40 | 28 days supply | Qty: 2 | Fill #4

## 2017-10-18 MED FILL — NAPROXEN 500 MG TABLET: 500 | 30 days supply | Qty: 60 | Fill #2

## 2017-10-27 MED FILL — HUMIRA PEN 40 MG/0.8ML PNKT: 40 | 28 days supply | Qty: 2 | Fill #5

## 2017-11-01 DIAGNOSIS — M0579 Rheumatoid arthritis with rheumatoid factor of multiple sites without organ or systems involvement: Secondary | ICD-10-CM | POA: Diagnosis not present

## 2017-11-01 DIAGNOSIS — Z6832 Body mass index (BMI) 32.0-32.9, adult: Secondary | ICD-10-CM | POA: Diagnosis not present

## 2017-11-01 DIAGNOSIS — M2550 Pain in unspecified joint: Secondary | ICD-10-CM | POA: Diagnosis not present

## 2017-11-01 DIAGNOSIS — E790 Hyperuricemia without signs of inflammatory arthritis and tophaceous disease: Secondary | ICD-10-CM | POA: Diagnosis not present

## 2017-11-01 DIAGNOSIS — E669 Obesity, unspecified: Secondary | ICD-10-CM | POA: Diagnosis not present

## 2017-11-01 DIAGNOSIS — R7989 Other specified abnormal findings of blood chemistry: Secondary | ICD-10-CM | POA: Diagnosis not present

## 2017-11-01 DIAGNOSIS — R972 Elevated prostate specific antigen [PSA]: Secondary | ICD-10-CM | POA: Diagnosis not present

## 2017-11-16 ENCOUNTER — Encounter: Payer: Self-pay | Admitting: Sports Medicine

## 2017-11-16 ENCOUNTER — Ambulatory Visit (INDEPENDENT_AMBULATORY_CARE_PROVIDER_SITE_OTHER): Payer: 59 | Admitting: Sports Medicine

## 2017-11-16 ENCOUNTER — Encounter (INDEPENDENT_AMBULATORY_CARE_PROVIDER_SITE_OTHER): Payer: Self-pay

## 2017-11-16 DIAGNOSIS — Z0000 Encounter for general adult medical examination without abnormal findings: Secondary | ICD-10-CM

## 2017-11-16 NOTE — Assessment & Plan Note (Signed)
Unremarkable physical as above, no change in plan.

## 2017-11-16 NOTE — Patient Instructions (Signed)
[email or web address]

## 2017-11-16 NOTE — Progress Notes (Signed)
Subjective:    CC: Annual physical  HPI:  Andre Cohen is here for his physical, he has no complaints, we diagnosed rheumatoid arthritis at the last visit, he is now stable on Humira with his rheumatologist.  Really has no complaints.  I reviewed the past medical history, family history, social history, surgical history, and allergies today and no changes were needed.  Please see the problem list section below in epic for further details.  Past Medical History: Past Medical History:  Diagnosis Date  . Asthma   . Carpal tunnel syndrome on both sides   . LFT elevation    VITAMIN USE IN THE PAST  . Sleep apnea    wear CPAP nightly   Past Surgical History: Past Surgical History:  Procedure Laterality Date  . CARPAL TUNNEL RELEASE Right 04/15/2016   Procedure: CARPAL TUNNEL RELEASE, right;  Surgeon: Daryll Brod, MD;  Location: [facility];  Service: Orthopedics;  Laterality: Right;  FAB  . WISDOM TOOTH EXTRACTION     Social History: Social History   Socioeconomic History  . Marital status: Married    Spouse name: Not on file  . Number of children: Not on file  . Years of education: Not on file  . Highest education level: Not on file  Occupational History  . Not on file  Social Needs  . Financial resource strain: Not on file  . Food insecurity:    Worry: Not on file    Inability: Not on file  . Transportation needs:    Medical: Not on file    Non-medical: Not on file  Tobacco Use  . Smoking status: Never Smoker  . Smokeless tobacco: Never Used  Substance and Sexual Activity  . Alcohol use: Yes    Comment: social  . Drug use: No  . Sexual activity: Not on file  Lifestyle  . Physical activity:    Days per week: Not on file    Minutes per session: Not on file  . Stress: Not on file  Relationships  . Social connections:    Talks on phone: Not on file    Gets together: Not on file    Attends religious service: Not on file    Active member of club or  organization: Not on file    Attends meetings of clubs or organizations: Not on file    Relationship status: Not on file  Other Topics Concern  . Not on file  Social History Narrative  . Not on file   Family History: Family History  Problem Relation Age of Onset  . Hypertension Mother   . Hypertension Father   . Hypertension Sister   . Hypertension Brother   . Cancer Brother        skin   Allergies: Allergies  Allergen Reactions  . Doxycycline Rash   Medications: See med rec.  Review of Systems: No headache, visual changes, nausea, vomiting, diarrhea, constipation, dizziness, abdominal pain, skin rash, fevers, chills, night sweats, swollen lymph nodes, weight loss, chest pain, body aches, joint swelling, muscle aches, shortness of breath, mood changes, visual or auditory hallucinations.  Objective:    General: Well Developed, well nourished, and in no acute distress.  Neuro: Alert and oriented x3, extra-ocular muscles intact, sensation grossly intact. Cranial nerves II through XII are intact, motor, sensory, and coordinative functions are all intact. HEENT: Normocephalic, atraumatic, pupils equal round reactive to light, neck supple, no masses, no lymphadenopathy, thyroid nonpalpable. Oropharynx, nasopharynx, external ear canals are unremarkable.  Skin: Warm and dry, no rashes noted.  Cardiac: Regular rate and rhythm, no murmurs rubs or gallops.  Respiratory: Clear to auscultation bilaterally. Not using accessory muscles, speaking in full sentences.  Abdominal: Soft, nontender, nondistended, positive bowel sounds, no masses, no organomegaly.  Musculoskeletal: Shoulder, elbow, wrist, hip, knee, ankle stable, and with full range of motion.  Impression and Recommendations:    The patient was counselled, risk factors were discussed, anticipatory guidance given.  Annual physical exam Unremarkable physical as above, no change in  plan. ___________________________________________ Gwen Her. Dianah Field, M.D., ABFM., CAQSM. Primary Care and [place] Instructor of [employer] [address] [employer]

## 2017-11-30 ENCOUNTER — Other Ambulatory Visit: Payer: Self-pay | Admitting: Internal Medicine

## 2017-11-30 MED ORDER — ADALIMUMAB 40 MG/0.8ML ~~LOC~~ AJKT: 1.0000 "pen " | AUTO-INJECTOR | SUBCUTANEOUS | 5 refills | Status: DC

## 2017-12-04 MED FILL — HUMIRA PEN 40 MG/0.8ML PNKT: 40 | 28 days supply | Qty: 2 | Fill #0

## 2017-12-04 MED FILL — NAPROXEN 500 MG TABLET: 500 | 30 days supply | Qty: 60 | Fill #3

## 2017-12-26 MED FILL — HUMIRA PEN 40 MG/0.8ML PNKT: 40 | 28 days supply | Qty: 2 | Fill #1

## 2018-01-01 DIAGNOSIS — L820 Inflamed seborrheic keratosis: Secondary | ICD-10-CM | POA: Diagnosis not present

## 2018-01-01 DIAGNOSIS — L821 Other seborrheic keratosis: Secondary | ICD-10-CM | POA: Diagnosis not present

## 2018-01-01 DIAGNOSIS — D225 Melanocytic nevi of trunk: Secondary | ICD-10-CM | POA: Diagnosis not present

## 2018-01-30 MED FILL — HUMIRA PEN 40 MG/0.8ML PNKT: 40 | 28 days supply | Qty: 2 | Fill #2

## 2018-02-08 ENCOUNTER — Other Ambulatory Visit: Payer: Self-pay | Admitting: Sports Medicine

## 2018-02-08 MED FILL — valACYclovir HCL 1 GM TABS: 1 | 7 days supply | Qty: 14 | Fill #0

## 2018-02-08 MED FILL — NAPROXEN 500 MG TABLET: 500 | 30 days supply | Qty: 60 | Fill #0

## 2018-02-22 MED FILL — HUMIRA PEN 40 MG/0.8ML PNKT: 40 | 28 days supply | Qty: 2 | Fill #3

## 2018-03-05 ENCOUNTER — Other Ambulatory Visit: Payer: Self-pay | Admitting: Pharmacist

## 2018-03-05 DIAGNOSIS — Z6832 Body mass index (BMI) 32.0-32.9, adult: Secondary | ICD-10-CM | POA: Diagnosis not present

## 2018-03-05 DIAGNOSIS — E669 Obesity, unspecified: Secondary | ICD-10-CM | POA: Diagnosis not present

## 2018-03-05 DIAGNOSIS — M2550 Pain in unspecified joint: Secondary | ICD-10-CM | POA: Diagnosis not present

## 2018-03-05 DIAGNOSIS — E790 Hyperuricemia without signs of inflammatory arthritis and tophaceous disease: Secondary | ICD-10-CM | POA: Diagnosis not present

## 2018-03-05 DIAGNOSIS — M0579 Rheumatoid arthritis with rheumatoid factor of multiple sites without organ or systems involvement: Secondary | ICD-10-CM | POA: Diagnosis not present

## 2018-03-05 DIAGNOSIS — R7989 Other specified abnormal findings of blood chemistry: Secondary | ICD-10-CM | POA: Diagnosis not present

## 2018-03-05 MED ORDER — ADALIMUMAB 40 MG/0.4ML ~~LOC~~ AJKT: 40.0000 mg | AUTO-INJECTOR | SUBCUTANEOUS | 5 refills | Status: DC

## 2018-03-06 ENCOUNTER — Ambulatory Visit (INDEPENDENT_AMBULATORY_CARE_PROVIDER_SITE_OTHER): Payer: 59 | Admitting: Pharmacist

## 2018-03-06 ENCOUNTER — Encounter: Payer: Self-pay | Admitting: Pharmacist

## 2018-03-06 DIAGNOSIS — Z79899 Other long term (current) drug therapy: Secondary | ICD-10-CM

## 2018-03-06 NOTE — Progress Notes (Signed)
   S: Patient virtually presents today to the [facility].  Patient is currently taking Humira for rheumatoid arthritis. Patient is managed by Dr. Amil Amen and Marella Chimes PA-C for this.   Adherence: denies any missed doses recently   Efficacy: he says he feels great on the medication and back to normal before he had RA  Dosing:  Rheumatoid arthritis: SubQ: 40 mg every other week (may continue methotrexate, other nonbiologic DMARDS, corticosteroids, NSAIDs, and/or analgesics); patients not taking concomitant methotrexate may increase dose to 40 mg every week  Drug-drug interactions: none  Screening: TB test: completed  Hepatitis: monitored by rheum  Monitoring: S/sx of infection: denies CBC: completed and monitored by rheum and PCP (see below) S/sx of hypersensitivity: denies S/sx of malignancy: denies S/sx of heart failure: denies  O:     Lab Results  Component Value Date   WBC 5.4 02/23/2017   HGB 14.4 02/23/2017   HCT 42.6 02/23/2017   MCV 86.6 02/23/2017   PLT 285 02/23/2017      Chemistry      Component Value Date/Time   NA 137 02/23/2017 1459   K 4.4 02/23/2017 1459   CL 102 02/23/2017 1459   CO2 28 02/23/2017 1459   BUN 17 02/23/2017 1459   CREATININE 0.95 02/23/2017 1459      Component Value Date/Time   CALCIUM 9.8 02/23/2017 1459   ALKPHOS 85 05/10/2016 1535   AST 29 02/23/2017 1459   ALT 42 02/23/2017 1459   BILITOT 0.6 02/23/2017 1459       A/P: 1. Medication review: Patient currently on Humira for rheumatoid arthritis and is tolerating the medication well with no adverse effects - it is working well for him and controlling his RA. Reviewed the medication with the patient, including the following: Humira is a TNF blocking agent indicated for ankylosing spondylitis, Crohn's disease, Hidradenitis suppurativa, psoriatic arthritis, plaque psoriasis, ulcerative colitis, and uveitis. The most common adverse  effects are infections, headache, and injection site reactions. There is the possibility of an increased risk of malignancy but it is not well understood if this increased risk is due to there medication or the disease state. There are rare cases of pancytopenia and aplastic anemia. There are no dose adjustments for Humira and hepatic impairment because it was not studied in hepatic impairment. No recommendations for any changes.    Christella Hartigan, PharmD, BCPS, BCACP, CPP Clinical Pharmacist Practitioner  [phone]

## 2018-03-15 MED FILL — HUMIRA PEN 40 MG/0.4ML PNKT: 40 | 28 days supply | Qty: 2 | Fill #0

## 2018-03-29 MED FILL — valACYclovir HCL 1 GM TABS: 1 | 7 days supply | Qty: 14 | Fill #1

## 2018-04-05 MED FILL — NAPROXEN 500 MG TABLET: 500 | 30 days supply | Qty: 60 | Fill #1

## 2018-04-19 MED FILL — HUMIRA PEN 40 MG/0.4ML PNKT: 40 | 28 days supply | Qty: 2 | Fill #1

## 2018-05-04 ENCOUNTER — Ambulatory Visit (INDEPENDENT_AMBULATORY_CARE_PROVIDER_SITE_OTHER): Payer: 59 | Admitting: Sports Medicine

## 2018-05-04 DIAGNOSIS — Z23 Encounter for immunization: Secondary | ICD-10-CM | POA: Diagnosis not present

## 2018-05-04 NOTE — Progress Notes (Signed)
Patient came into clinic today for flu vaccination. Patient was given a flu shot questionnaire prior to administration of immunization. All questions were answered no. Patient tolerated injection of flu immunization in right deltoid well, with no immediate complications. An information pamphlet regarding flu shot immunization and frequently asked questions was given to Patient prior to leaving clinic. Advised to contact our office with any questions/concerns.   Unsure why immunization clinic did not close encounter.

## 2018-05-16 MED FILL — HUMIRA PEN 40 MG/0.4ML PNKT: 40 | 28 days supply | Qty: 2 | Fill #2

## 2018-05-31 MED FILL — NAPROXEN 500 MG TABLET: 500 | 30 days supply | Qty: 60 | Fill #2

## 2018-06-14 ENCOUNTER — Encounter: Payer: Self-pay | Admitting: Sports Medicine

## 2018-06-14 ENCOUNTER — Ambulatory Visit (INDEPENDENT_AMBULATORY_CARE_PROVIDER_SITE_OTHER): Payer: 59 | Admitting: Sports Medicine

## 2018-06-14 ENCOUNTER — Ambulatory Visit: Payer: Self-pay | Admitting: Physician Assistant

## 2018-06-14 ENCOUNTER — Ambulatory Visit (INDEPENDENT_AMBULATORY_CARE_PROVIDER_SITE_OTHER): Payer: 59

## 2018-06-14 DIAGNOSIS — M25571 Pain in right ankle and joints of right foot: Secondary | ICD-10-CM | POA: Diagnosis not present

## 2018-06-14 DIAGNOSIS — S99911A Unspecified injury of right ankle, initial encounter: Secondary | ICD-10-CM

## 2018-06-14 DIAGNOSIS — X501XXA Overexertion from prolonged static or awkward postures, initial encounter: Secondary | ICD-10-CM | POA: Diagnosis not present

## 2018-06-14 DIAGNOSIS — S8261XA Displaced fracture of lateral malleolus of right fibula, initial encounter for closed fracture: Secondary | ICD-10-CM | POA: Insufficient documentation

## 2018-06-14 MED ORDER — HYDROCODONE-ACETAMINOPHEN 5-325 MG PO TABS: 1.0000 | ORAL_TABLET | Freq: Three times a day (TID) | ORAL | 0 refills | Status: DC | PRN

## 2018-06-14 MED FILL — HYDROCODON-APAP 5-325: 5-325 | 5 days supply | Qty: 15 | Fill #0

## 2018-06-14 MED FILL — HUMIRA PEN 40 MG/0.4ML PNKT: 40 | 28 days supply | Qty: 2 | Fill #3

## 2018-06-14 NOTE — Assessment & Plan Note (Addendum)
Inversion injury with tenderness over the fibula. Cam boot, hydrocodone, x-rays. Return in 2 weeks.  X-rays do show an avulsion fracture from the fibula, continue boot, expect 6 to 8 weeks of treatment.

## 2018-06-14 NOTE — Progress Notes (Signed)
Subjective:    CC: Ankle injury  HPI: Recently this pleasant 47 year old male was playing basketball with his boys, he came down, inverted his ankle, had immediate pain, swelling, bruising.  He had very much difficulty walking right after the injury.  I reviewed the past medical history, family history, social history, surgical history, and allergies today and no changes were needed.  Please see the problem list section below in epic for further details.  Past Medical History: Past Medical History:  Diagnosis Date  . Asthma   . Carpal tunnel syndrome on both sides   . LFT elevation    VITAMIN USE IN THE PAST  . Sleep apnea    wear CPAP nightly   Past Surgical History: Past Surgical History:  Procedure Laterality Date  . CARPAL TUNNEL RELEASE Right 04/15/2016   Procedure: CARPAL TUNNEL RELEASE, right;  Surgeon: Daryll Brod, MD;  Location: [place];  Service: Orthopedics;  Laterality: Right;  FAB  . WISDOM TOOTH EXTRACTION     Social History: Social History   Socioeconomic History  . Marital status: Married    Spouse name: Not on file  . Number of children: Not on file  . Years of education: Not on file  . Highest education level: Not on file  Occupational History  . Not on file  Social Needs  . Financial resource strain: Not on file  . Food insecurity:    Worry: Not on file    Inability: Not on file  . Transportation needs:    Medical: Not on file    Non-medical: Not on file  Tobacco Use  . Smoking status: Never Smoker  . Smokeless tobacco: Never Used  Substance and Sexual Activity  . Alcohol use: Yes    Comment: social  . Drug use: No  . Sexual activity: Not on file  Lifestyle  . Physical activity:    Days per week: Not on file    Minutes per session: Not on file  . Stress: Not on file  Relationships  . Social connections:    Talks on phone: Not on file    Gets together: Not on file    Attends religious service: Not on file    Active  member of club or organization: Not on file    Attends meetings of clubs or organizations: Not on file    Relationship status: Not on file  Other Topics Concern  . Not on file  Social History Narrative  . Not on file   Family History: Family History  Problem Relation Age of Onset  . Hypertension Mother   . Hypertension Father   . Hypertension Sister   . Hypertension Brother   . Cancer Brother        skin   Allergies: Allergies  Allergen Reactions  . Doxycycline Rash   Medications: See med rec.  Review of Systems: No fevers, chills, night sweats, weight loss, chest pain, or shortness of breath.   Objective:    General: Well Developed, well nourished, and in no acute distress.  Neuro: Alert and oriented x3, extra-ocular muscles intact, sensation grossly intact.  HEENT: Normocephalic, atraumatic, pupils equal round reactive to light, neck supple, no masses, no lymphadenopathy, thyroid nonpalpable.  Skin: Warm and dry, no rashes. Cardiac: Regular rate and rhythm, no murmurs rubs or gallops, no lower extremity edema.  Respiratory: Clear to auscultation bilaterally. Not using accessory muscles, speaking in full sentences. Right ankle: Swollen with tenderness over the fibula Range of motion  is full in all directions. Strength is 5/5 in all directions. Stable lateral and medial ligaments; squeeze test and kleiger test unremarkable; Tal[place] dome nontender; No pain at base of 5th MT; No tenderness over cuboid; No tenderness over N spot or navicular prominence No sign of peroneal tendon subluxations; Negative tarsal tunnel tinel's Able to walk 4 steps.  X-rays show a lateral malleolus avulsion.  Impression and Recommendations:    Right ankle injury Inversion injury with tenderness over the fibula. Cam boot, hydrocodone, x-rays. Return in 2 weeks.  X-rays do show an avulsion fracture from the fibula, continue boot, expect 6 to 8 weeks of  treatment. ___________________________________________ Gwen Her. Dianah Field, M.D., ABFM., CAQSM. Primary Care and Sports Medicine [facility]  Adjunct Professor of [employer]

## 2018-06-18 DIAGNOSIS — S8264XA Nondisplaced fracture of lateral malleolus of right fibula, initial encounter for closed fracture: Secondary | ICD-10-CM | POA: Diagnosis not present

## 2018-06-28 ENCOUNTER — Encounter: Payer: Self-pay | Admitting: Sports Medicine

## 2018-06-28 ENCOUNTER — Ambulatory Visit (INDEPENDENT_AMBULATORY_CARE_PROVIDER_SITE_OTHER): Payer: 59 | Admitting: Sports Medicine

## 2018-06-28 DIAGNOSIS — S8264XD Nondisplaced fracture of lateral malleolus of right fibula, subsequent encounter for closed fracture with routine healing: Secondary | ICD-10-CM | POA: Diagnosis not present

## 2018-06-28 NOTE — Assessment & Plan Note (Signed)
2 weeks in the boot. I think he needs another week or 2, and then he can transition into a lace up ankle brace. Still has some minimal tenderness over the fracture. He does have a golf trip coming up at the end of the month.

## 2018-06-28 NOTE — Patient Instructions (Signed)
Goal heart rate is 140 bpm, keep it here for 30 minutes 3-5 times a week. When you go to the gym talk to 1 of the trainers about a regimen or you can do an upper body circuit 1 day, a lower body circuit another day and a core circuit another day.  Medicines for tinea pedis/athlete's foot include topical clotrimazole or topical terbinafine.

## 2018-06-28 NOTE — Progress Notes (Signed)
Subjective:    CC: Follow-up  HPI: 2 weeks ago this pleasant 48 year old male inverted his right ankle, fracturing his fibula.  He is overall doing well in a boot.  I reviewed the past medical history, family history, social history, surgical history, and allergies today and no changes were needed.  Please see the problem list section below in epic for further details.  Past Medical History: Past Medical History:  Diagnosis Date  . Asthma   . Carpal tunnel syndrome on both sides   . LFT elevation    VITAMIN USE IN THE PAST  . Sleep apnea    wear CPAP nightly   Past Surgical History: Past Surgical History:  Procedure Laterality Date  . CARPAL TUNNEL RELEASE Right 04/15/2016   Procedure: CARPAL TUNNEL RELEASE, right;  Surgeon: Daryll Brod, MD;  Location: [facility];  Service: Orthopedics;  Laterality: Right;  FAB  . WISDOM TOOTH EXTRACTION     Social History: Social History   Socioeconomic History  . Marital status: Married    Spouse name: Not on file  . Number of children: Not on file  . Years of education: Not on file  . Highest education level: Not on file  Occupational History  . Not on file  Social Needs  . Financial resource strain: Not on file  . Food insecurity:    Worry: Not on file    Inability: Not on file  . Transportation needs:    Medical: Not on file    Non-medical: Not on file  Tobacco Use  . Smoking status: Never Smoker  . Smokeless tobacco: Never Used  Substance and Sexual Activity  . Alcohol use: Yes    Comment: social  . Drug use: No  . Sexual activity: Not on file  Lifestyle  . Physical activity:    Days per week: Not on file    Minutes per session: Not on file  . Stress: Not on file  Relationships  . Social connections:    Talks on phone: Not on file    Gets together: Not on file    Attends religious service: Not on file    Active member of club or organization: Not on file    Attends meetings of clubs or  organizations: Not on file    Relationship status: Not on file  Other Topics Concern  . Not on file  Social History Narrative  . Not on file   Family History: Family History  Problem Relation Age of Onset  . Hypertension Mother   . Hypertension Father   . Hypertension Sister   . Hypertension Brother   . Cancer Brother        skin   Allergies: Allergies  Allergen Reactions  . Doxycycline Rash   Medications: See med rec.  Review of Systems: No fevers, chills, night sweats, weight loss, chest pain, or shortness of breath.   Objective:    General: Well Developed, well nourished, and in no acute distress.  Neuro: Alert and oriented x3, extra-ocular muscles intact, sensation grossly intact.  HEENT: Normocephalic, atraumatic, pupils equal round reactive to light, neck supple, no masses, no lymphadenopathy, thyroid nonpalpable.  Skin: Warm and dry, no rashes. Cardiac: Regular rate and rhythm, no murmurs rubs or gallops, no lower extremity edema.  Respiratory: Clear to auscultation bilaterally. Not using accessory muscles, speaking in full sentences. Right ankle: Still swollen with minimal tenderness over the fracture Range of motion is full in all directions. Strength is 5/5 in  all directions. Stable lateral and medial ligaments; squeeze test and kleiger test unremarkable; [place] dome nontender; No pain at base of 5th MT; No tenderness over cuboid; No tenderness over N spot or navicular prominence No tenderness on posterior aspects of lateral and medial malleolus No sign of peroneal tendon subluxations; Negative tarsal tunnel tinel's Able to walk 4 steps.  Impression and Recommendations:    Fracture of ankle, lateral malleolus, right, closed 2 weeks in the boot. I think he needs another week or 2, and then he can transition into a lace up ankle brace. Still has some minimal tenderness over the fracture. He does have a golf trip coming up at the end of the month.  I spent  25 minutes with this patient, greater than 50% was face-to-face time counseling regarding the above diagnoses, specifically treatment at home of tinea pedis ___________________________________________ Gwen Her. Dianah Field, M.D., ABFM., CAQSM. Primary Care and Sports Medicine [facility]  Adjunct Professor of [employer] [facility] of Medicine

## 2018-07-10 MED FILL — HUMIRA PEN 40 MG/0.4ML PNKT: 40 | 28 days supply | Qty: 2 | Fill #4

## 2018-07-12 ENCOUNTER — Encounter: Payer: Self-pay | Admitting: Sports Medicine

## 2018-07-26 ENCOUNTER — Ambulatory Visit (INDEPENDENT_AMBULATORY_CARE_PROVIDER_SITE_OTHER): Payer: Commercial Managed Care - PPO | Admitting: Sports Medicine

## 2018-07-26 ENCOUNTER — Encounter: Payer: Self-pay | Admitting: Sports Medicine

## 2018-07-26 DIAGNOSIS — R490 Dysphonia: Secondary | ICD-10-CM | POA: Diagnosis not present

## 2018-07-26 DIAGNOSIS — E7849 Other hyperlipidemia: Secondary | ICD-10-CM | POA: Diagnosis not present

## 2018-07-26 DIAGNOSIS — S8264XD Nondisplaced fracture of lateral malleolus of right fibula, subsequent encounter for closed fracture with routine healing: Secondary | ICD-10-CM | POA: Diagnosis not present

## 2018-07-26 NOTE — Assessment & Plan Note (Signed)
Rechecking routine labs. 

## 2018-07-26 NOTE — Assessment & Plan Note (Signed)
Continues to improve, it is been 5 to 6 weeks. I did explain to him that a lower extremity fracture can take 6 to 8 weeks to heal, and even longer for patients that are immunosuppressed. Activities as tolerated in this point.

## 2018-07-26 NOTE — Progress Notes (Signed)
Subjective:    CC: Follow-up  HPI: Andre Cohen returns for his fibular fracture, this is doing okay 5 weeks post injury.  He has noted an odd/vague symptom of shortness of breath when talking excessively.  He does not really have any exercise intolerance, no cough, wheezing.  He simply feels as though he cannot catch his breath during speech.  No chest pain, no shortness of breath otherwise, no leg swelling, no paroxysmal nocturnal dyspnea, no orthopnea.  I reviewed the past medical history, family history, social history, surgical history, and allergies today and no changes were needed.  Please see the problem list section below in epic for further details.  Past Medical History: Past Medical History:  Diagnosis Date  . Asthma   . Carpal tunnel syndrome on both sides   . LFT elevation    VITAMIN USE IN THE PAST  . Sleep apnea    wear CPAP nightly   Past Surgical History: Past Surgical History:  Procedure Laterality Date  . CARPAL TUNNEL RELEASE Right 04/15/2016   Procedure: CARPAL TUNNEL RELEASE, right;  Surgeon: Daryll Brod, MD;  Location: [facility];  Service: Orthopedics;  Laterality: Right;  FAB  . WISDOM TOOTH EXTRACTION     Social History: Social History   Socioeconomic History  . Marital status: Married    Spouse name: Not on file  . Number of children: Not on file  . Years of education: Not on file  . Highest education level: Not on file  Occupational History  . Not on file  Social Needs  . Financial resource strain: Not on file  . Food insecurity:    Worry: Not on file    Inability: Not on file  . Transportation needs:    Medical: Not on file    Non-medical: Not on file  Tobacco Use  . Smoking status: Never Smoker  . Smokeless tobacco: Never Used  Substance and Sexual Activity  . Alcohol use: Yes    Comment: social  . Drug use: No  . Sexual activity: Not on file  Lifestyle  . Physical activity:    Days per week: Not on file    Minutes per  session: Not on file  . Stress: Not on file  Relationships  . Social connections:    Talks on phone: Not on file    Gets together: Not on file    Attends religious service: Not on file    Active member of club or organization: Not on file    Attends meetings of clubs or organizations: Not on file    Relationship status: Not on file  Other Topics Concern  . Not on file  Social History Narrative  . Not on file   Family History: Family History  Problem Relation Age of Onset  . Hypertension Mother   . Hypertension Father   . Hypertension Sister   . Hypertension Brother   . Cancer Brother        skin   Allergies: Allergies  Allergen Reactions  . Doxycycline Rash   Medications: See med rec.  Review of Systems: No fevers, chills, night sweats, weight loss, chest pain, or shortness of breath.   Objective:    General: Well Developed, well nourished, and in no acute distress.  Neuro: Alert and oriented x3, extra-ocular muscles intact, sensation grossly intact.  HEENT: Normocephalic, atraumatic, pupils equal round reactive to light, neck supple, no masses, no lymphadenopathy, thyroid nonpalpable.  Skin: Warm and dry, no rashes. Cardiac: Regular  rate and rhythm, no murmurs rubs or gallops, no lower extremity edema.  Respiratory: Clear to auscultation bilaterally. Not using accessory muscles, speaking in full sentences. Right ankle: No visible erythema or swelling. Range of motion is full in all directions. Strength is 5/5 in all directions. Stable lateral and medial ligaments; squeeze test and kleiger test unremarkable; Tal[place] dome nontender; No pain at base of 5th MT; No tenderness over cuboid; No tenderness over N spot or navicular prominence No tenderness on posterior aspects of lateral and medial malleolus No sign of peroneal tendon subluxations; Negative tarsal tunnel tinel's Able to walk 4 steps.  Impression and Recommendations:    Fracture of ankle, lateral  malleolus, right, closed Continues to improve, it is been 5 to 6 weeks. I did explain to him that a lower extremity fracture can take 6 to 8 weeks to heal, and even longer for patients that are immunosuppressed. Activities as tolerated in this point.  Dysphonia Odd sensations of shortness of breath in the middle of sentences. Checking some routine labs. He will return for pre-and postbronchodilator spirometry. Ultimately if this persists we may need to refer him to ENT for direct visualization.  Hyperlipidemia Rechecking routine labs. ___________________________________________ Gwen Her. Dianah Field, M.D., ABFM., CAQSM. Primary Care and Sports Medicine [facility]  Adjunct Professor of [employer] [facility] of Medicine

## 2018-07-26 NOTE — Assessment & Plan Note (Signed)
Odd sensations of shortness of breath in the middle of sentences. Checking some routine labs. He will return for pre-and postbronchodilator spirometry. Ultimately if this persists we may need to refer him to ENT for direct visualization.

## 2018-07-31 ENCOUNTER — Ambulatory Visit (INDEPENDENT_AMBULATORY_CARE_PROVIDER_SITE_OTHER): Payer: Commercial Managed Care - PPO | Admitting: Sports Medicine

## 2018-07-31 VITALS — BP 139/86 | HR 75 | Temp 98.2°F | Ht 73.0 in | Wt 231.0 lb

## 2018-07-31 DIAGNOSIS — R0602 Shortness of breath: Secondary | ICD-10-CM | POA: Diagnosis not present

## 2018-07-31 DIAGNOSIS — G4733 Obstructive sleep apnea (adult) (pediatric): Secondary | ICD-10-CM | POA: Diagnosis not present

## 2018-07-31 DIAGNOSIS — Z23 Encounter for immunization: Secondary | ICD-10-CM | POA: Diagnosis not present

## 2018-07-31 DIAGNOSIS — R490 Dysphonia: Secondary | ICD-10-CM | POA: Diagnosis not present

## 2018-07-31 DIAGNOSIS — E7849 Other hyperlipidemia: Secondary | ICD-10-CM

## 2018-07-31 LAB — PULMONARY FUNCTION TEST

## 2018-07-31 NOTE — Progress Notes (Addendum)
Subjective:    CC: Follow-up  HPI: Andre Cohen is a pleasant 48 year old male, we have been working him up for a vague dysphonia.  He would describe sensations of trouble catching his breath after speaking for prolonged periods of time.  He got his blood work done this morning, and he is here to discuss pre-and postbronchodilator spirometry.  I reviewed the past medical history, family history, social history, surgical history, and allergies today and no changes were needed.  Please see the problem list section below in epic for further details.  Past Medical History: Past Medical History:  Diagnosis Date  . Asthma   . Carpal tunnel syndrome on both sides   . LFT elevation    VITAMIN USE IN THE PAST  . Sleep apnea    wear CPAP nightly   Past Surgical History: Past Surgical History:  Procedure Laterality Date  . CARPAL TUNNEL RELEASE Right 04/15/2016   Procedure: CARPAL TUNNEL RELEASE, right;  Surgeon: Daryll Brod, MD;  Location: [facility];  Service: Orthopedics;  Laterality: Right;  FAB  . WISDOM TOOTH EXTRACTION     Social History: Social History   Socioeconomic History  . Marital status: Married    Spouse name: Not on file  . Number of children: Not on file  . Years of education: Not on file  . Highest education level: Not on file  Occupational History  . Not on file  Social Needs  . Financial resource strain: Not on file  . Food insecurity:    Worry: Not on file    Inability: Not on file  . Transportation needs:    Medical: Not on file    Non-medical: Not on file  Tobacco Use  . Smoking status: Never Smoker  . Smokeless tobacco: Never Used  Substance and Sexual Activity  . Alcohol use: Yes    Comment: social  . Drug use: No  . Sexual activity: Not on file  Lifestyle  . Physical activity:    Days per week: Not on file    Minutes per session: Not on file  . Stress: Not on file  Relationships  . Social connections:    Talks on phone: Not on file     Gets together: Not on file    Attends religious service: Not on file    Active member of club or organization: Not on file    Attends meetings of clubs or organizations: Not on file    Relationship status: Not on file  Other Topics Concern  . Not on file  Social History Narrative  . Not on file   Family History: Family History  Problem Relation Age of Onset  . Hypertension Mother   . Hypertension Father   . Hypertension Sister   . Hypertension Brother   . Cancer Brother        skin   Allergies: Allergies  Allergen Reactions  . Doxycycline Rash   Medications: See med rec.  Review of Systems: No fevers, chills, night sweats, weight loss, chest pain, or shortness of breath.   Objective:    General: Well Developed, well nourished, and in no acute distress.  Neuro: Alert and oriented x3, extra-ocular muscles intact, sensation grossly intact.  HEENT: Normocephalic, atraumatic, pupils equal round reactive to light, neck supple, no masses, no lymphadenopathy, thyroid nonpalpable.  Skin: Warm and dry, no rashes. Cardiac: Regular rate and rhythm, no murmurs rubs or gallops, no lower extremity edema.  Respiratory: Clear to auscultation bilaterally. Not using  accessory muscles, speaking in full sentences.  Pre-and postbronchodilator spirometry performed, it is normal before bronchodilator and unchanged after.  Impression and Recommendations:    Dysphonia Odd sensation of shortness of breath in the middle of sentences. He did have his labs done today. Pre-and postbronchodilator spirometry was normal. We do suspect that this may be due to being out of shape. He is going to get back into the gym, lose some weight, if improvement in symptoms then we know the diagnosis. If not we will refer to ENT for direct vocal cord visualization and discussion of vocal cord dysfunction syndrome.  Hyperlipidemia Lipids are elevated, we will consider lipid medication. Awaiting callback from  patient. ___________________________________________ Gwen Her. Dianah Field, M.D., ABFM., CAQSM. Primary Care and Sports Medicine [facility]  Adjunct Professor of [place] [employer] [facility] [employer] of Medicine

## 2018-07-31 NOTE — Assessment & Plan Note (Signed)
Odd sensation of shortness of breath in the middle of sentences. He did have his labs done today. Pre-and postbronchodilator spirometry was normal. We do suspect that this may be due to being out of shape. He is going to get back into the gym, lose some weight, if improvement in symptoms then we know the diagnosis. If not we will refer to ENT for direct vocal cord visualization and discussion of vocal cord dysfunction syndrome.

## 2018-08-01 ENCOUNTER — Other Ambulatory Visit: Payer: Self-pay | Admitting: Sports Medicine

## 2018-08-01 LAB — COMPREHENSIVE METABOLIC PANEL
AG Ratio: 1.6 (calc) (ref 1.0–2.5)
ALT: 83 U/L — ABNORMAL HIGH (ref 9–46)
AST: 50 U/L — ABNORMAL HIGH (ref 10–35)
Albumin: 4.5 g/dL (ref 3.6–5.1)
Alkaline phosphatase (APISO): 51 U/L (ref 35–144)
BUN: 14 mg/dL (ref 7–25)
CO2: 27 mmol/L (ref 20–32)
Calcium: 9.8 mg/dL (ref 8.6–10.3)
Chloride: 104 mmol/L (ref 98–110)
Creat: 0.98 mg/dL (ref 0.70–1.33)
Globulin: 2.8 g/dL (calc) (ref 1.9–3.7)
Glucose, Bld: 104 mg/dL — ABNORMAL HIGH (ref 65–99)
Potassium: 4.6 mmol/L (ref 3.5–5.3)
Sodium: 138 mmol/L (ref 135–146)
Total Bilirubin: 0.8 mg/dL (ref 0.2–1.2)
Total Protein: 7.3 g/dL (ref 6.1–8.1)

## 2018-08-01 LAB — CBC
HCT: 43.4 % (ref 38.5–50.0)
Hemoglobin: 14.9 g/dL (ref 13.2–17.1)
MCH: 30.7 pg (ref 27.0–33.0)
MCHC: 34.3 g/dL (ref 32.0–36.0)
MCV: 89.5 fL (ref 80.0–100.0)
MPV: 12.3 fL (ref 7.5–12.5)
Platelets: 259 10*3/uL (ref 140–400)
RBC: 4.85 10*6/uL (ref 4.20–5.80)
RDW: 12.4 % (ref 11.0–15.0)
WBC: 5.7 10*3/uL (ref 3.8–10.8)

## 2018-08-01 LAB — LIPID PANEL W/REFLEX DIRECT LDL
Cholesterol: 229 mg/dL — ABNORMAL HIGH (ref <200)
HDL: 43 mg/dL (ref >=40)
LDL Cholesterol (Calc): 166 mg/dL (calc) — ABNORMAL HIGH
Non-HDL Cholesterol (Calc): 186 mg/dL (calc) — ABNORMAL HIGH (ref <130)
Total CHOL/HDL Ratio: 5.3 (calc) — ABNORMAL HIGH (ref <5.0)
Triglycerides: 92 mg/dL (ref <150)

## 2018-08-01 LAB — HEMOGLOBIN A1C
Hgb A1c MFr Bld: 5.8 % of total Hgb — ABNORMAL HIGH (ref <5.7)
Mean Plasma Glucose: 120 (calc)
eAG (mmol/L): 6.6 (calc)

## 2018-08-01 LAB — VITAMIN D 25 HYDROXY (VIT D DEFICIENCY, FRACTURES): Vit D, 25-Hydroxy: 26 ng/mL — ABNORMAL LOW (ref 30–100)

## 2018-08-01 LAB — TSH: TSH: 1.54 mIU/L (ref 0.40–4.50)

## 2018-08-01 LAB — D-DIMER, QUANTITATIVE: D-Dimer, Quant: 0.23 mcg/mL FEU (ref <0.50)

## 2018-08-01 MED ORDER — VITAMIN D (ERGOCALCIFEROL) 1.25 MG (50000 UNIT) PO CAPS: 50000.0000 [IU] | ORAL_CAPSULE | ORAL | 0 refills | Status: DC

## 2018-08-01 MED FILL — VIT D2 1.25 MG (50,000 UNIT: 1.25 MG | 56 days supply | Qty: 8 | Fill #0

## 2018-08-01 NOTE — Assessment & Plan Note (Signed)
Lipids are elevated, we will consider lipid medication. Awaiting callback from patient.

## 2018-08-13 MED FILL — HUMIRA PEN 40 MG/0.4ML PNKT: 40 | 28 days supply | Qty: 2 | Fill #5

## 2018-08-24 MED FILL — NAPROXEN 500 MG TABLET: 500 | 30 days supply | Qty: 60 | Fill #0

## 2018-08-24 MED FILL — valACYclovir HCL 1 GM TABS: 1 | 7 days supply | Qty: 14 | Fill #2

## 2018-08-30 ENCOUNTER — Other Ambulatory Visit: Payer: Self-pay | Admitting: Pharmacist

## 2018-08-30 MED ORDER — ADALIMUMAB 40 MG/0.4ML ~~LOC~~ AJKT: 40.0000 mg | AUTO-INJECTOR | SUBCUTANEOUS | 2 refills | Status: DC

## 2018-09-03 DIAGNOSIS — M2550 Pain in unspecified joint: Secondary | ICD-10-CM | POA: Diagnosis not present

## 2018-09-03 DIAGNOSIS — E669 Obesity, unspecified: Secondary | ICD-10-CM | POA: Diagnosis not present

## 2018-09-03 DIAGNOSIS — Z6832 Body mass index (BMI) 32.0-32.9, adult: Secondary | ICD-10-CM | POA: Diagnosis not present

## 2018-09-03 DIAGNOSIS — R7989 Other specified abnormal findings of blood chemistry: Secondary | ICD-10-CM | POA: Diagnosis not present

## 2018-09-03 DIAGNOSIS — M0579 Rheumatoid arthritis with rheumatoid factor of multiple sites without organ or systems involvement: Secondary | ICD-10-CM | POA: Diagnosis not present

## 2018-09-03 DIAGNOSIS — E790 Hyperuricemia without signs of inflammatory arthritis and tophaceous disease: Secondary | ICD-10-CM | POA: Diagnosis not present

## 2018-09-03 MED FILL — HUMIRA PEN 40 MG/0.4ML PNKT: 40 | 28 days supply | Qty: 2 | Fill #0 | Status: TO

## 2018-09-05 ENCOUNTER — Other Ambulatory Visit: Payer: Self-pay | Admitting: Pharmacist

## 2018-09-05 MED ORDER — ADALIMUMAB 40 MG/0.4ML ~~LOC~~ AJKT: 40.0000 mg | AUTO-INJECTOR | SUBCUTANEOUS | 5 refills | Status: DC

## 2018-10-03 MED FILL — HUMIRA PEN 40 MG/0.4ML PNKT: 40 | 28 days supply | Qty: 2 | Fill #0

## 2018-11-05 MED FILL — HUMIRA PEN 40 MG/0.4ML PNKT: 40 | 28 days supply | Qty: 2 | Fill #1

## 2018-11-19 ENCOUNTER — Ambulatory Visit (INDEPENDENT_AMBULATORY_CARE_PROVIDER_SITE_OTHER): Payer: 59 | Admitting: Sports Medicine

## 2018-11-19 ENCOUNTER — Encounter: Payer: Self-pay | Admitting: Sports Medicine

## 2018-11-19 VITALS — BP 126/82 | HR 89 | Ht 73.0 in | Wt 220.0 lb

## 2018-11-19 DIAGNOSIS — Z0000 Encounter for general adult medical examination without abnormal findings: Secondary | ICD-10-CM

## 2018-11-19 DIAGNOSIS — R972 Elevated prostate specific antigen [PSA]: Secondary | ICD-10-CM

## 2018-11-19 DIAGNOSIS — Z1211 Encounter for screening for malignant neoplasm of colon: Secondary | ICD-10-CM | POA: Diagnosis not present

## 2018-11-19 DIAGNOSIS — E7849 Other hyperlipidemia: Secondary | ICD-10-CM

## 2018-11-19 LAB — HEMOCCULT GUIAC POC 1CARD (OFFICE): Fecal Occult Blood, POC: NEGATIVE

## 2018-11-19 MED ORDER — ATORVASTATIN CALCIUM 10 MG PO TABS: 10.0000 mg | ORAL_TABLET | Freq: Every day | ORAL | 3 refills | Status: DC

## 2018-11-19 MED ORDER — VALACYCLOVIR HCL 1 G PO TABS: 1000.0000 mg | ORAL_TABLET | Freq: Two times a day (BID) | ORAL | 11 refills | Status: DC

## 2018-11-19 MED FILL — valACYclovir HCL 1 GM TABS: 1 | 7 days supply | Qty: 14 | Fill #0

## 2018-11-19 MED FILL — ATORVASTATIN 10 MG TABLET: 10 | 90 days supply | Qty: 90 | Fill #0

## 2018-11-19 NOTE — Progress Notes (Signed)
Subjective:    CC: Annual physical exam  HPI:  This is a pleasant 48 year old male here for his physical.  I reviewed the past medical history, family history, social history, surgical history, and allergies today and no changes were needed.  Please see the problem list section below in epic for further details.  Past Medical History: Past Medical History:  Diagnosis Date  . Asthma   . Carpal tunnel syndrome on both sides   . LFT elevation    VITAMIN USE IN THE PAST  . Sleep apnea    wear CPAP nightly   Past Surgical History: Past Surgical History:  Procedure Laterality Date  . CARPAL TUNNEL RELEASE Right 04/15/2016   Procedure: CARPAL TUNNEL RELEASE, right;  Surgeon: Daryll Brod, MD;  Location: [facility];  Service: Orthopedics;  Laterality: Right;  FAB  . WISDOM TOOTH EXTRACTION     Social History: Social History   Socioeconomic History  . Marital status: Married    Spouse name: Not on file  . Number of children: Not on file  . Years of education: Not on file  . Highest education level: Not on file  Occupational History  . Not on file  Social Needs  . Financial resource strain: Not on file  . Food insecurity:    Worry: Not on file    Inability: Not on file  . Transportation needs:    Medical: Not on file    Non-medical: Not on file  Tobacco Use  . Smoking status: Never Smoker  . Smokeless tobacco: Never Used  Substance and Sexual Activity  . Alcohol use: Yes    Comment: social  . Drug use: No  . Sexual activity: Not on file  Lifestyle  . Physical activity:    Days per week: Not on file    Minutes per session: Not on file  . Stress: Not on file  Relationships  . Social connections:    Talks on phone: Not on file    Gets together: Not on file    Attends religious service: Not on file    Active member of club or organization: Not on file    Attends meetings of clubs or organizations: Not on file    Relationship status: Not on file   Other Topics Concern  . Not on file  Social History Narrative  . Not on file   Family History: Family History  Problem Relation Age of Onset  . Hypertension Mother   . Hypertension Father   . Hypertension Sister   . Hypertension Brother   . Cancer Brother        skin   Allergies: Allergies  Allergen Reactions  . Doxycycline Rash   Medications: See med rec.  Review of Systems: No headache, visual changes, nausea, vomiting, diarrhea, constipation, dizziness, abdominal pain, skin rash, fevers, chills, night sweats, swollen lymph nodes, weight loss, chest pain, body aches, joint swelling, muscle aches, shortness of breath, mood changes, visual or auditory hallucinations.  Objective:    General: Well Developed, well nourished, and in no acute distress.  Neuro: Alert and oriented x3, extra-ocular muscles intact, sensation grossly intact. Cranial nerves II through XII are intact, motor, sensory, and coordinative functions are all intact. HEENT: Normocephalic, atraumatic, pupils equal round reactive to light, neck supple, no masses, no lymphadenopathy, thyroid nonpalpable. Oropharynx, nasopharynx, external ear canals are unremarkable. Skin: Warm and dry, no rashes noted.  Cardiac: Regular rate and rhythm, no murmurs rubs or gallops.  Respiratory: Clear  to auscultation bilaterally. Not using accessory muscles, speaking in full sentences.  Abdominal: Soft, nontender, nondistended, positive bowel sounds, no masses, no organomegaly.  Musculoskeletal: Shoulder, elbow, wrist, hip, knee, ankle stable, and with full range of motion. Rectal: Good tone, smooth prostate, Hemoccult negative.  There were a few external, nonbleeding hemorrhoids.  Impression and Recommendations:    The patient was counselled, risk factors were discussed, anticipatory guidance given.  Annual physical exam Annual physical as above today. Cologuard.  Elevated PSA Unremarkable prostate exam today. We will recheck  his PSA will be recheck labs in 2 to 3 months.  Hyperlipidemia Adding Lipitor 10. Recheck lipids, CMP in 2 to 3 months.   ___________________________________________ Gwen Her. Dianah Field, M.D., ABFM., CAQSM. Primary Care and Sports Medicine [facility]  Adjunct Professor of [place] [employer] [facility] [employer] of Medicine

## 2018-11-19 NOTE — Assessment & Plan Note (Signed)
Adding Lipitor 10. Recheck lipids, CMP in 2 to 3 months.

## 2018-11-19 NOTE — Assessment & Plan Note (Addendum)
Annual physical as above today. Cologuard.

## 2018-11-19 NOTE — Assessment & Plan Note (Signed)
Unremarkable prostate exam today. We will recheck his PSA will be recheck labs in 2 to 3 months.

## 2018-11-30 MED FILL — HUMIRA PEN 40 MG/0.4ML PNKT: 40 | 28 days supply | Qty: 2 | Fill #0

## 2018-12-19 MED FILL — NAPROXEN 500 MG TABLET: 500 | 30 days supply | Qty: 60 | Fill #1

## 2018-12-25 DIAGNOSIS — Z0000 Encounter for general adult medical examination without abnormal findings: Secondary | ICD-10-CM | POA: Diagnosis not present

## 2018-12-25 MED FILL — HUMIRA PEN 40 MG/0.4ML PNKT: 40 | 28 days supply | Qty: 2 | Fill #1

## 2019-01-03 LAB — COLOGUARD: Cologuard: NEGATIVE

## 2019-01-07 DIAGNOSIS — L570 Actinic keratosis: Secondary | ICD-10-CM | POA: Diagnosis not present

## 2019-01-07 DIAGNOSIS — L821 Other seborrheic keratosis: Secondary | ICD-10-CM | POA: Diagnosis not present

## 2019-01-07 DIAGNOSIS — D225 Melanocytic nevi of trunk: Secondary | ICD-10-CM | POA: Diagnosis not present

## 2019-01-11 ENCOUNTER — Encounter: Payer: Self-pay | Admitting: Sports Medicine

## 2019-01-28 MED FILL — HUMIRA PEN 40 MG/0.4ML PNKT: 40 | 28 days supply | Qty: 2 | Fill #2

## 2019-02-12 MED FILL — valACYclovir HCL 1 GM TABS: 1 | 7 days supply | Qty: 14 | Fill #1

## 2019-02-12 MED FILL — ATORVASTATIN 10 MG TABLET: 10 | 90 days supply | Qty: 90 | Fill #1

## 2019-02-15 MED FILL — NAPROXEN 500 MG TABLET: 500 | 90 days supply | Qty: 180 | Fill #0

## 2019-02-26 MED FILL — NAPROXEN 500 MG TABLET: 500 | 90 days supply | Qty: 180 | Fill #0

## 2019-03-05 ENCOUNTER — Other Ambulatory Visit: Payer: Self-pay | Admitting: Pharmacist

## 2019-03-05 DIAGNOSIS — R7989 Other specified abnormal findings of blood chemistry: Secondary | ICD-10-CM | POA: Diagnosis not present

## 2019-03-05 DIAGNOSIS — E669 Obesity, unspecified: Secondary | ICD-10-CM | POA: Diagnosis not present

## 2019-03-05 DIAGNOSIS — Z6832 Body mass index (BMI) 32.0-32.9, adult: Secondary | ICD-10-CM | POA: Diagnosis not present

## 2019-03-05 DIAGNOSIS — M0579 Rheumatoid arthritis with rheumatoid factor of multiple sites without organ or systems involvement: Secondary | ICD-10-CM | POA: Diagnosis not present

## 2019-03-05 DIAGNOSIS — M2550 Pain in unspecified joint: Secondary | ICD-10-CM | POA: Diagnosis not present

## 2019-03-05 DIAGNOSIS — E790 Hyperuricemia without signs of inflammatory arthritis and tophaceous disease: Secondary | ICD-10-CM | POA: Diagnosis not present

## 2019-03-05 MED ORDER — HUMIRA (2 PEN) 40 MG/0.4ML ~~LOC~~ AJKT: 40.0000 mg | AUTO-INJECTOR | SUBCUTANEOUS | 5 refills | Status: DC

## 2019-03-05 MED FILL — ATORVASTATIN 10 MG TABLET: 10 | 90 days supply | Qty: 90 | Fill #1

## 2019-03-05 MED FILL — valACYclovir HCL 1 GM TABS: 1 | 7 days supply | Qty: 14 | Fill #1

## 2019-03-07 MED FILL — HUMIRA PEN 40 MG/0.4ML PNKT: 40 | 28 days supply | Qty: 2 | Fill #0

## 2019-03-20 DIAGNOSIS — E7849 Other hyperlipidemia: Secondary | ICD-10-CM | POA: Diagnosis not present

## 2019-03-20 DIAGNOSIS — Z0000 Encounter for general adult medical examination without abnormal findings: Secondary | ICD-10-CM | POA: Diagnosis not present

## 2019-03-20 DIAGNOSIS — R972 Elevated prostate specific antigen [PSA]: Secondary | ICD-10-CM | POA: Diagnosis not present

## 2019-03-21 ENCOUNTER — Other Ambulatory Visit: Payer: Self-pay | Admitting: Sports Medicine

## 2019-03-21 LAB — COMPLETE METABOLIC PANEL WITH GFR
AG Ratio: 1.7 (calc) (ref 1.0–2.5)
ALT: 34 U/L (ref 9–46)
AST: 27 U/L (ref 10–35)
Albumin: 4.6 g/dL (ref 3.6–5.1)
Alkaline phosphatase (APISO): 52 U/L (ref 35–144)
BUN: 13 mg/dL (ref 7–25)
CO2: 27 mmol/L (ref 20–32)
Calcium: 9.9 mg/dL (ref 8.6–10.3)
Chloride: 105 mmol/L (ref 98–110)
Creat: 0.93 mg/dL (ref 0.70–1.33)
GFR, Est African American: 111 mL/min/{1.73_m2} (ref >=60)
GFR, Est Non African American: 95 mL/min/{1.73_m2} (ref >=60)
Globulin: 2.7 g/dL (calc) (ref 1.9–3.7)
Glucose, Bld: 113 mg/dL — ABNORMAL HIGH (ref 65–99)
Potassium: 4.5 mmol/L (ref 3.5–5.3)
Sodium: 139 mmol/L (ref 135–146)
Total Bilirubin: 0.6 mg/dL (ref 0.2–1.2)
Total Protein: 7.3 g/dL (ref 6.1–8.1)

## 2019-03-21 LAB — LIPID PANEL W/REFLEX DIRECT LDL
Cholesterol: 161 mg/dL (ref <200)
HDL: 49 mg/dL (ref >=40)
LDL Cholesterol (Calc): 98 mg/dL (calc)
Non-HDL Cholesterol (Calc): 112 mg/dL (calc) (ref <130)
Total CHOL/HDL Ratio: 3.3 (calc) (ref <5.0)
Triglycerides: 54 mg/dL (ref <150)

## 2019-03-21 LAB — VITAMIN D 25 HYDROXY (VIT D DEFICIENCY, FRACTURES): Vit D, 25-Hydroxy: 28 ng/mL — ABNORMAL LOW (ref 30–100)

## 2019-03-21 LAB — PSA, TOTAL AND FREE
PSA, % Free: 43 % (calc) (ref >25)
PSA, Free: 0.3 ng/mL
PSA, Total: 0.7 ng/mL (ref <=4.0)

## 2019-03-21 MED ORDER — VITAMIN D (ERGOCALCIFEROL) 1.25 MG (50000 UNIT) PO CAPS: 50000.0000 [IU] | ORAL_CAPSULE | ORAL | 0 refills | Status: DC

## 2019-03-21 MED FILL — VIT D2 1.25 MG (50,000 UNIT: 1.25 MG | 56 days supply | Qty: 8 | Fill #0

## 2019-03-22 MED FILL — predniSONE 5 MG TABS: 5 | 12 days supply | Qty: 48 | Fill #0

## 2019-03-25 ENCOUNTER — Other Ambulatory Visit: Payer: Self-pay

## 2019-03-25 ENCOUNTER — Ambulatory Visit (INDEPENDENT_AMBULATORY_CARE_PROVIDER_SITE_OTHER): Payer: BC Managed Care – PPO | Admitting: Sports Medicine

## 2019-03-25 DIAGNOSIS — Z23 Encounter for immunization: Secondary | ICD-10-CM | POA: Diagnosis not present

## 2019-06-01 MED FILL — valACYclovir HCL 1 GM TABS: 1 | 7 days supply | Qty: 14 | Fill #2

## 2019-07-09 MED FILL — ATORVASTATIN 10 MG TABLET: 10 | 90 days supply | Qty: 90 | Fill #2

## 2019-08-05 MED FILL — NAPROXEN 500 MG TABLET: 500 | 90 days supply | Qty: 180 | Fill #1

## 2019-08-29 ENCOUNTER — Ambulatory Visit: Payer: Self-pay | Attending: Internal Medicine

## 2019-08-29 ENCOUNTER — Other Ambulatory Visit: Payer: Self-pay

## 2019-08-29 DIAGNOSIS — Z23 Encounter for immunization: Secondary | ICD-10-CM

## 2019-08-29 NOTE — Progress Notes (Signed)
   Covid-19 Vaccination Clinic  Name:  Imre Guerrieri    MRN: [id number] DOB: March 18, 1971  08/29/2019  Mr. Garriott was observed post Covid-19 immunization for 15 minutes without incident. He was provided with Vaccine Information Sheet and instruction to access the V-Safe system.   Mr. Sharman was instructed to call 911 with any severe reactions post vaccine: Marland Kitchen Difficulty breathing  . Swelling of face and throat  . A fast heartbeat  . A bad rash all over body  . Dizziness and weakness   Immunizations Administered    Name Date Dose VIS Date Route   Pfizer COVID-19 Vaccine 08/29/2019  8:43 AM 0.3 mL 05/31/2019 Intramuscular   Manufacturer: [place]   Lot: [id number]   [place]: [id number]

## 2019-09-03 ENCOUNTER — Other Ambulatory Visit (HOSPITAL_COMMUNITY): Payer: Self-pay | Admitting: Physician Assistant

## 2019-09-03 DIAGNOSIS — E790 Hyperuricemia without signs of inflammatory arthritis and tophaceous disease: Secondary | ICD-10-CM | POA: Diagnosis not present

## 2019-09-03 DIAGNOSIS — M0579 Rheumatoid arthritis with rheumatoid factor of multiple sites without organ or systems involvement: Secondary | ICD-10-CM | POA: Diagnosis not present

## 2019-09-23 ENCOUNTER — Ambulatory Visit: Payer: Self-pay | Attending: Internal Medicine

## 2019-09-23 DIAGNOSIS — Z23 Encounter for immunization: Secondary | ICD-10-CM

## 2019-09-23 NOTE — Progress Notes (Signed)
   Covid-19 Vaccination Clinic  Name:  Andre Cohen    MRN: [id number] DOB: 02/06/1971  09/23/2019  [name] was observed post Covid-19 immunization for 15 minutes without incident. He was provided with Vaccine Information Sheet and instruction to access the V-Safe system.   [name] was instructed to call 911 with any severe reactions post vaccine: Marland Kitchen Difficulty breathing  . Swelling of face and throat  . A fast heartbeat  . A bad rash all over body  . Dizziness and weakness   Immunizations Administered    Name Date Dose VIS Date Route   Pfizer COVID-19 Vaccine 09/23/2019  8:50 AM 0.3 mL 05/31/2019 Intramuscular   Manufacturer: [place]   Lot: [id number]   [place]: [id number]

## 2019-10-14 MED FILL — valACYclovir HCL 1 GM TABS: 1 | 7 days supply | Qty: 14 | Fill #3

## 2019-10-21 ENCOUNTER — Other Ambulatory Visit (HOSPITAL_COMMUNITY): Payer: Self-pay | Admitting: Sports Medicine

## 2019-10-21 MED ORDER — VALACYCLOVIR HCL 1 G PO TABS: 1000.0000 mg | ORAL_TABLET | Freq: Every day | ORAL | 3 refills | Status: DC | PRN

## 2019-10-21 MED FILL — valACYclovir HCL 1 GM TABS: 1 | 90 days supply | Qty: 90 | Fill #0

## 2019-11-12 ENCOUNTER — Other Ambulatory Visit: Payer: Self-pay

## 2019-11-12 ENCOUNTER — Encounter: Payer: Self-pay | Admitting: Family Medicine

## 2019-11-12 ENCOUNTER — Ambulatory Visit (INDEPENDENT_AMBULATORY_CARE_PROVIDER_SITE_OTHER): Payer: BC Managed Care – PPO | Admitting: Family Medicine

## 2019-11-12 VITALS — BP 131/94 | HR 66 | Temp 97.7°F | Ht 72.84 in | Wt 238.3 lb

## 2019-11-12 DIAGNOSIS — Z23 Encounter for immunization: Secondary | ICD-10-CM

## 2019-11-12 DIAGNOSIS — T148XXA Other injury of unspecified body region, initial encounter: Secondary | ICD-10-CM | POA: Diagnosis not present

## 2019-11-12 NOTE — Assessment & Plan Note (Signed)
Appears to be healing well.  No signs of infection at this time. He will continue to monitor at home.  Updated Tdap given today.

## 2019-11-12 NOTE — Progress Notes (Signed)
Andre Cohen - 49 y.o. male MRN [id number]  Date of birth: [birth year]  Subjective Chief Complaint  Patient presents with  . Wound Check    HPI Andre Cohen is a 49 y.o. male here today with complaint of puncture wound from screw to L leg.  This occurred a couple of days ago while moving a dining room table.  Area is healing well and he denies increased pain or drainage.  He is overdue for Tetanus vaccine and would like to have this completed today.   ROS:  A comprehensive ROS was completed and negative except as noted per HPI  Allergies  Allergen Reactions  . Doxycycline Rash    Past Medical History:  Diagnosis Date  . Asthma   . Carpal tunnel syndrome on both sides   . LFT elevation    VITAMIN USE IN THE PAST  . Sleep apnea    wear CPAP nightly    Past Surgical History:  Procedure Laterality Date  . CARPAL TUNNEL RELEASE Right 04/15/2016   Procedure: CARPAL TUNNEL RELEASE, right;  Surgeon: Daryll Brod, MD;  Location: [place];  Service: Orthopedics;  Laterality: Right;  FAB  . WISDOM TOOTH EXTRACTION      Social History   Socioeconomic History  . Marital status: Married    Spouse name: Not on file  . Number of children: Not on file  . Years of education: Not on file  . Highest education level: Not on file  Occupational History  . Not on file  Tobacco Use  . Smoking status: Never Smoker  . Smokeless tobacco: Never Used  Substance and Sexual Activity  . Alcohol use: Yes    Comment: social  . Drug use: No  . Sexual activity: Not on file  Other Topics Concern  . Not on file  Social History Narrative  . Not on file   Social Determinants of Health   Financial Resource Strain:   . Difficulty of Paying Living Expenses:   Food Insecurity:   . Worried About Charity fundraiser in the Last Year:   . Arboriculturist in the Last Year:   Transportation Needs:   . Film/video editor (Medical):   Marland Kitchen Lack of Transportation  (Non-Medical):   Physical Activity:   . Days of Exercise per Week:   . Minutes of Exercise per Session:   Stress:   . Feeling of Stress :   Social Connections:   . Frequency of Communication with Friends and Family:   . Frequency of Social Gatherings with Friends and Family:   . Attends Religious Services:   . Active Member of Clubs or Organizations:   . Attends Archivist Meetings:   Marland Kitchen Marital Status:     Family History  Problem Relation Age of Onset  . Hypertension Mother   . Hypertension Father   . Hypertension Sister   . Hypertension Brother   . Cancer Brother        skin    Health Maintenance  Topic Date Due  . HIV Screening  Never done  . COLONOSCOPY  Never done  . TETANUS/TDAP  06/21/2019  . PNEUMOCOCCAL POLYSACCHARIDE VACCINE AGE 62-64 HIGH RISK  Completed  . COVID-19 Vaccine  Completed     ----------------------------------------------------------------------------------------------------------------------------------------------------------------------------------------------------------------- Physical Exam BP (!) 131/94 (BP Location: Left Arm, Patient Position: Sitting, Cuff Size: Large)   Pulse 66   Temp 97.7 F (36.5 C) (Temporal)   Ht 6' 0.84" (1.85 m)  Wt 238 lb 4.8 oz (108.1 kg)   SpO2 97%   BMI 31.58 kg/m   Physical Exam Constitutional:      Appearance: Normal appearance.  HENT:     Head: Normocephalic and atraumatic.  Skin:    Comments: Small puncture wound to R distal thigh.  Scabbed over.  No surrounding erythema, warmth or swelling  Neurological:     General: No focal deficit present.     Mental Status: He is alert.  Psychiatric:        Mood and Affect: Mood normal.        Behavior: Behavior normal.     ------------------------------------------------------------------------------------------------------------------------------------------------------------------------------------------------------------------- Assessment  and Plan  Puncture wound Appears to be healing well.  No signs of infection at this time. He will continue to monitor at home.  Updated Tdap given today.    No orders of the defined types were placed in this encounter.   No follow-ups on file.    This visit occurred during the SARS-CoV-2 public health emergency.  Safety protocols were in place, including screening questions prior to the visit, additional usage of staff PPE, and extensive cleaning of exam room while observing appropriate contact time as indicated for disinfecting solutions.

## 2019-12-31 ENCOUNTER — Other Ambulatory Visit: Payer: Self-pay | Admitting: Sports Medicine

## 2019-12-31 ENCOUNTER — Other Ambulatory Visit (HOSPITAL_COMMUNITY): Payer: Self-pay | Admitting: Sports Medicine

## 2019-12-31 DIAGNOSIS — E7849 Other hyperlipidemia: Secondary | ICD-10-CM

## 2019-12-31 MED FILL — ATORVASTATIN CALCIUM 10 MG: 10 | 90 days supply | Qty: 90 | Fill #0

## 2020-01-08 DIAGNOSIS — L820 Inflamed seborrheic keratosis: Secondary | ICD-10-CM | POA: Diagnosis not present

## 2020-01-08 DIAGNOSIS — D235 Other benign neoplasm of skin of trunk: Secondary | ICD-10-CM | POA: Diagnosis not present

## 2020-01-08 DIAGNOSIS — L821 Other seborrheic keratosis: Secondary | ICD-10-CM | POA: Diagnosis not present

## 2020-01-22 MED FILL — valACYclovir HCL 1 GM TABS: 1 | 90 days supply | Qty: 90 | Fill #1

## 2020-02-21 MED FILL — NAPROXEN 500 MG TABLET: 500 | 90 days supply | Qty: 180 | Fill #0

## 2020-03-05 DIAGNOSIS — E790 Hyperuricemia without signs of inflammatory arthritis and tophaceous disease: Secondary | ICD-10-CM | POA: Diagnosis not present

## 2020-03-05 DIAGNOSIS — M2550 Pain in unspecified joint: Secondary | ICD-10-CM | POA: Diagnosis not present

## 2020-03-05 DIAGNOSIS — M0579 Rheumatoid arthritis with rheumatoid factor of multiple sites without organ or systems involvement: Secondary | ICD-10-CM | POA: Diagnosis not present

## 2020-03-05 DIAGNOSIS — R7989 Other specified abnormal findings of blood chemistry: Secondary | ICD-10-CM | POA: Diagnosis not present

## 2020-03-11 DIAGNOSIS — Z20822 Contact with and (suspected) exposure to covid-19: Secondary | ICD-10-CM | POA: Diagnosis not present

## 2020-03-11 DIAGNOSIS — R0981 Nasal congestion: Secondary | ICD-10-CM | POA: Diagnosis not present

## 2020-04-14 ENCOUNTER — Ambulatory Visit: Payer: BC Managed Care – PPO

## 2020-04-20 MED FILL — valACYclovir HCL 1 GM TABS: 1 | 90 days supply | Qty: 90 | Fill #2

## 2020-04-21 MED FILL — ATORVASTATIN CALCIUM 10 MG: 10 | 90 days supply | Qty: 90 | Fill #1

## 2020-04-22 ENCOUNTER — Ambulatory Visit (INDEPENDENT_AMBULATORY_CARE_PROVIDER_SITE_OTHER): Payer: BC Managed Care – PPO | Admitting: Sports Medicine

## 2020-04-22 DIAGNOSIS — Z23 Encounter for immunization: Secondary | ICD-10-CM

## 2020-05-04 IMAGING — DX DG ANKLE COMPLETE 3+V*R*
3 series · 3 of 3 positions shown · non-contrast
Comparison: None.

CLINICAL DATA: Lateral right ankle pain and swelling since
yesterday. Patient rolled his ankle playing basketball.

EXAM:
RIGHT ANKLE - COMPLETE 3+ VIEW

[ankle ap]
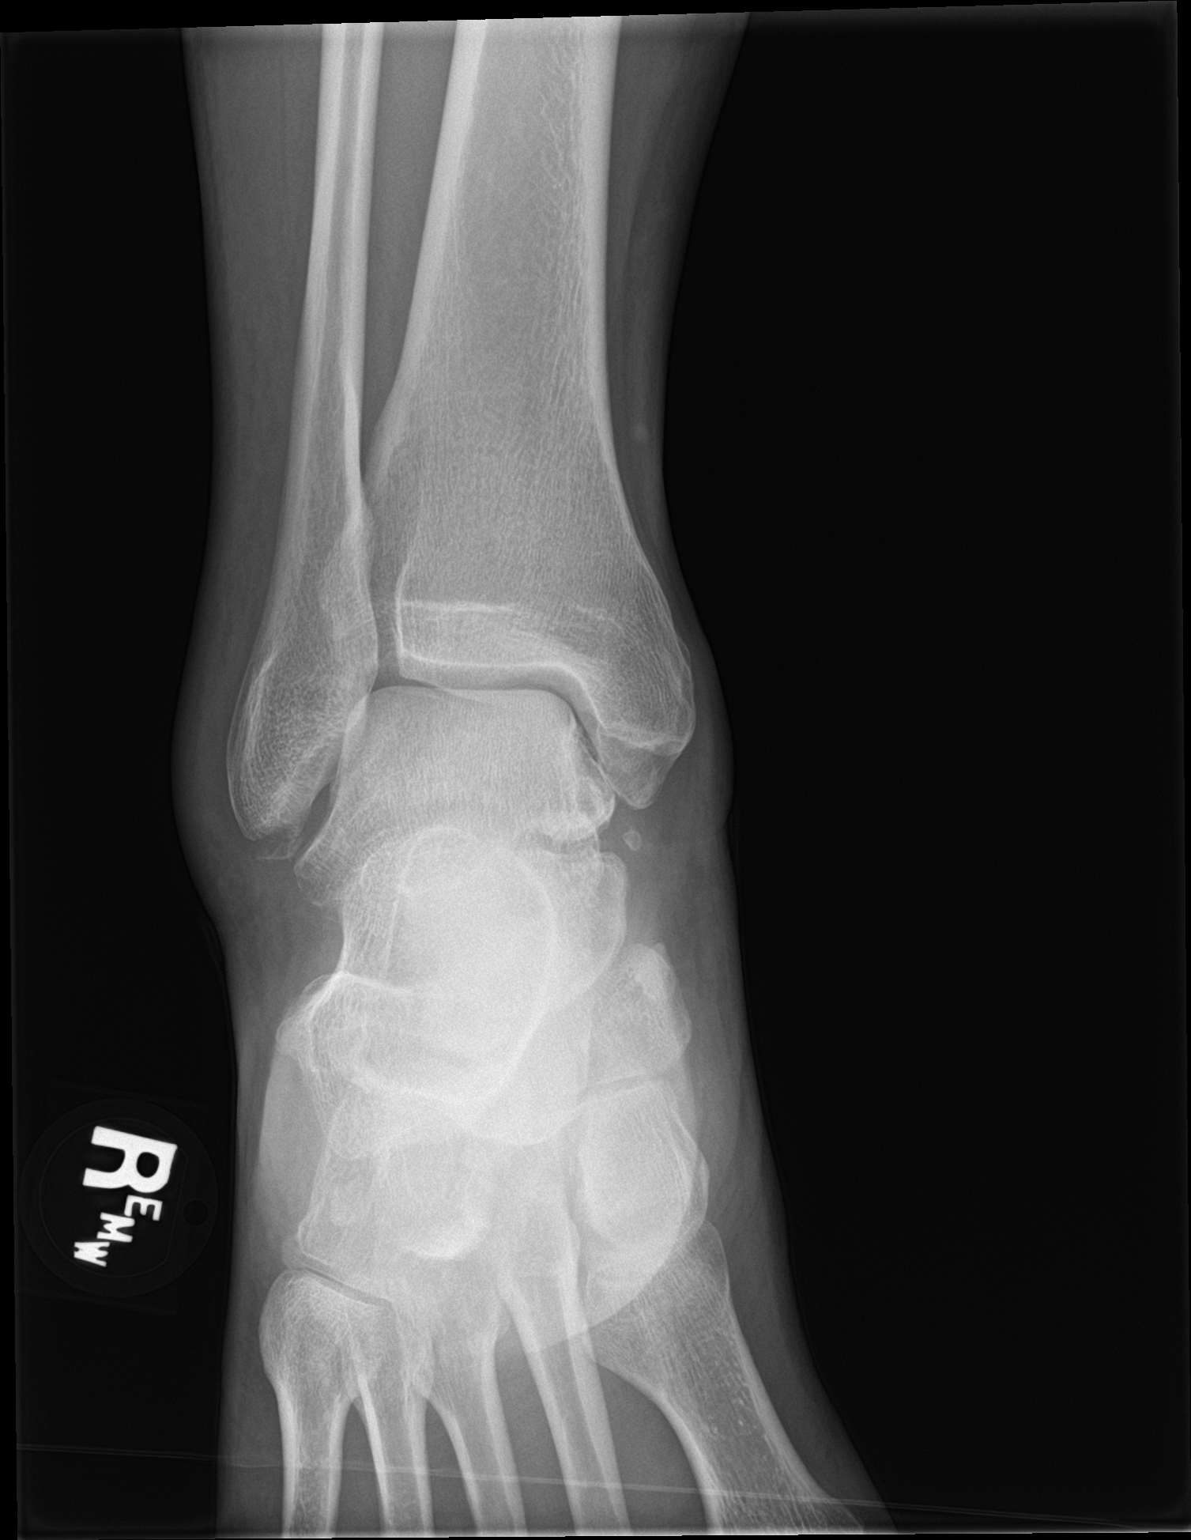

[ankle obl]
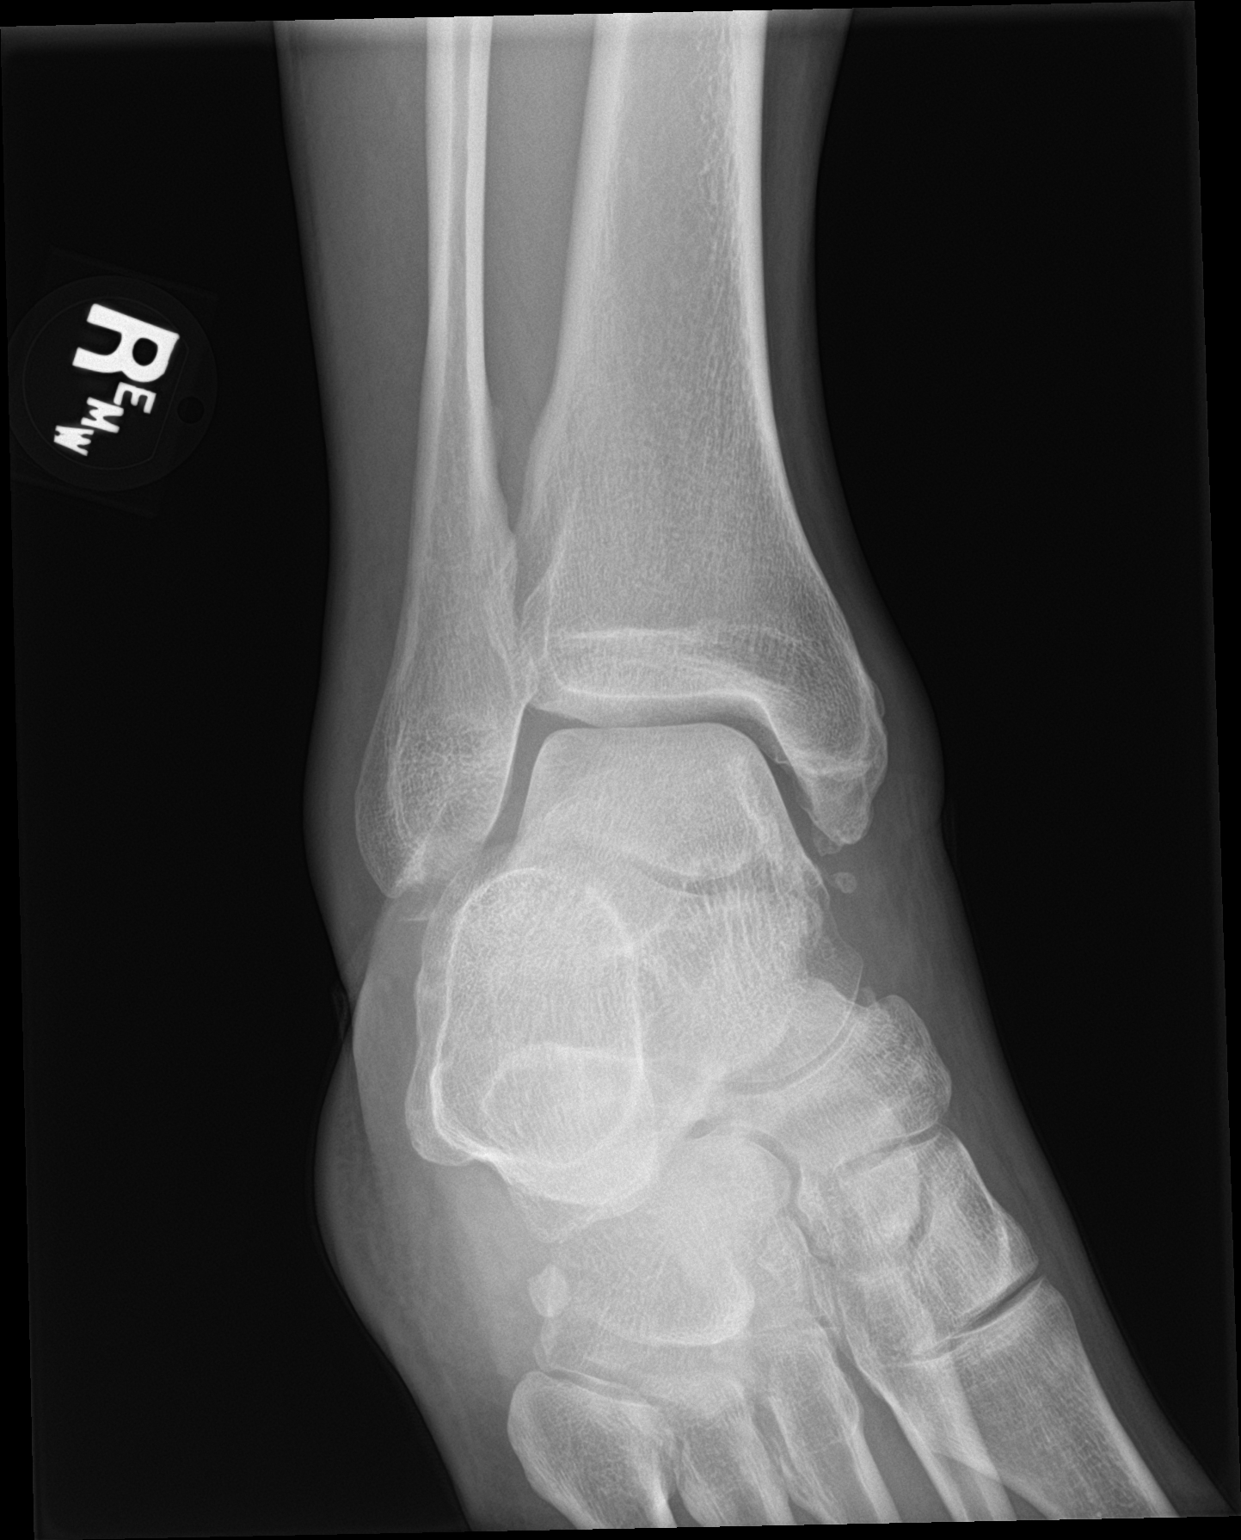

[ankle lat]
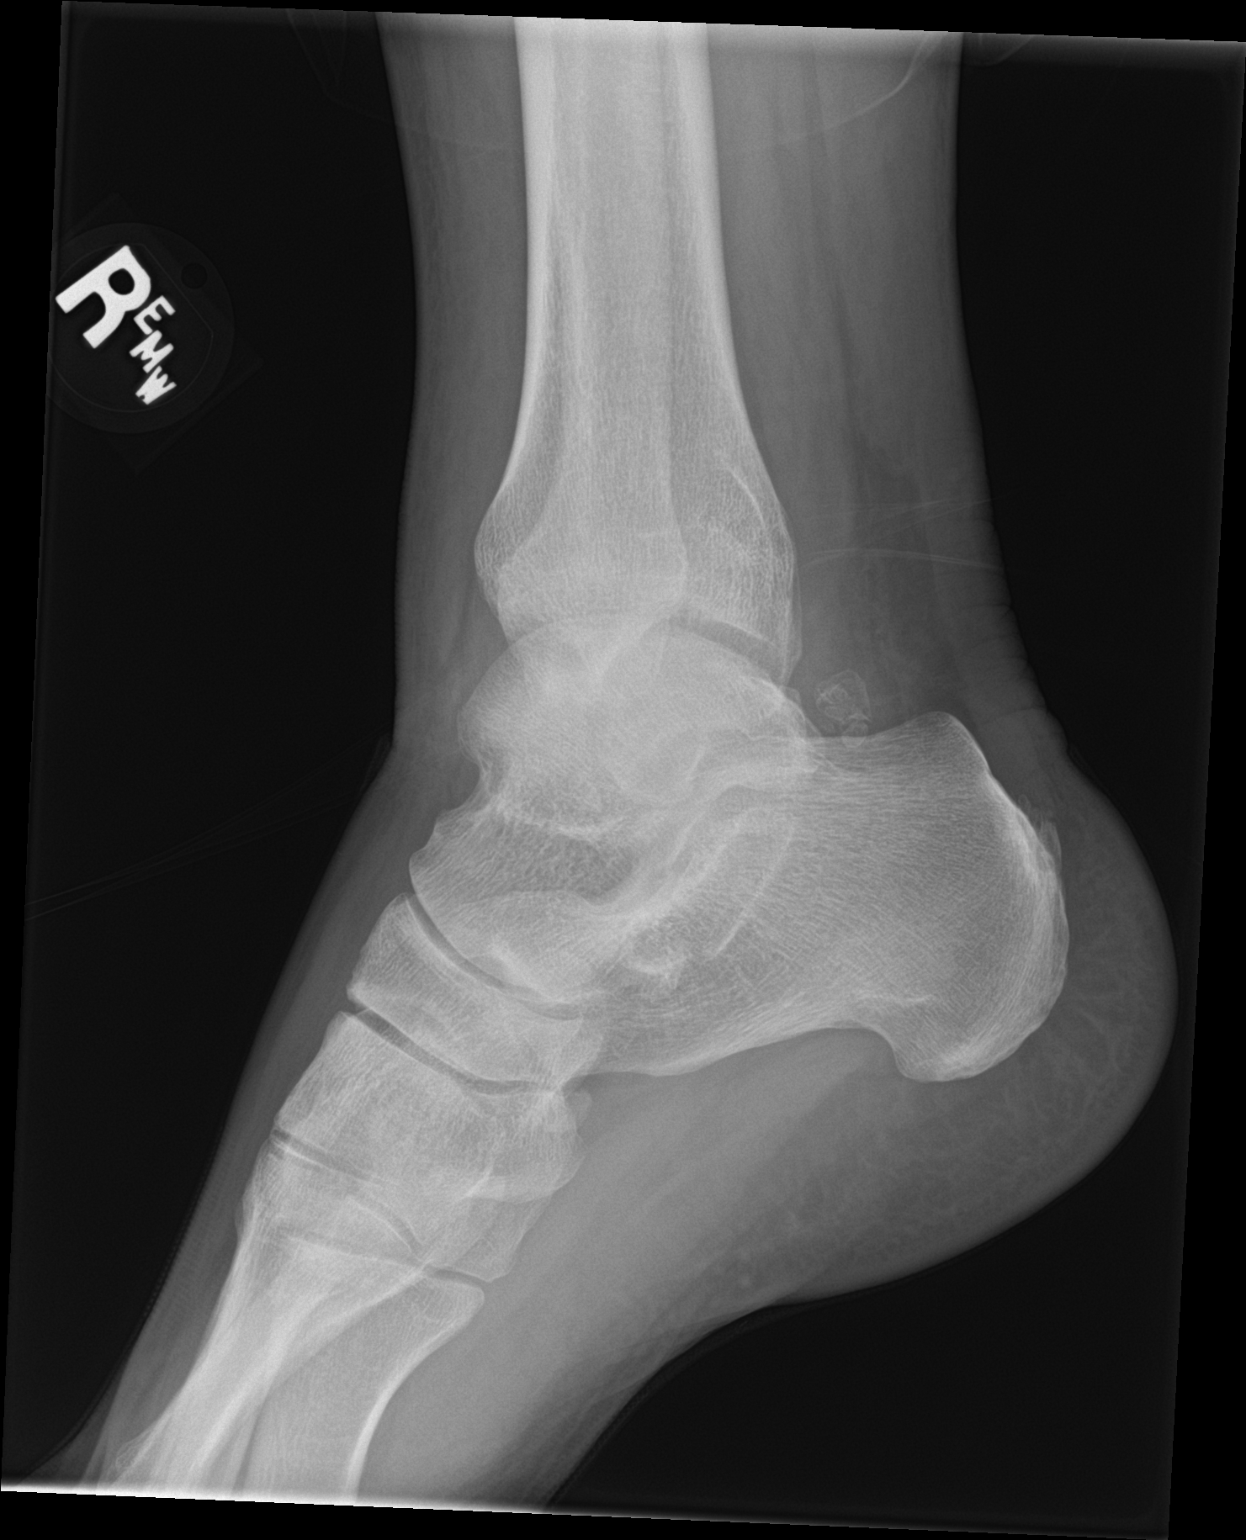

[3 of 3 positions shown; findings below may reference images not displayed]

FINDINGS: There is an avulsion fracture off the distal fibula, acute in
appearance. Well corticated calcification distal to the medial
malleolus is likely from a remote avulsion injury. The ankle mortise
is intact. Lateral soft tissue swelling is identified. No other
acute fractures.
IMPRESSION: 1. Soft tissue calcification distal to the fibula is consistent with
an acute avulsion injury.
2. Evidence of remote avulsion injury off the medial malleolus.
3. Lateral soft tissue swelling.
4. No other acute abnormalities.

## 2020-05-25 ENCOUNTER — Telehealth: Payer: Self-pay

## 2020-05-25 NOTE — Telephone Encounter (Signed)
FYI - Tosha @ [facility] called to make you aware that she is now the Nurse Case Manager for the patient. She can be reached at [phone] ext [phone].

## 2020-06-04 DIAGNOSIS — Z20822 Contact with and (suspected) exposure to covid-19: Secondary | ICD-10-CM | POA: Diagnosis not present

## 2020-06-05 ENCOUNTER — Other Ambulatory Visit (HOSPITAL_COMMUNITY): Payer: Self-pay | Admitting: Sports Medicine

## 2020-06-05 ENCOUNTER — Other Ambulatory Visit: Payer: Self-pay

## 2020-06-05 ENCOUNTER — Ambulatory Visit (INDEPENDENT_AMBULATORY_CARE_PROVIDER_SITE_OTHER): Payer: BC Managed Care – PPO | Admitting: Sports Medicine

## 2020-06-05 DIAGNOSIS — K625 Hemorrhage of anus and rectum: Secondary | ICD-10-CM

## 2020-06-05 LAB — HEMOCCULT GUIAC POC 1CARD (OFFICE): Fecal Occult Blood, POC: POSITIVE — AB

## 2020-06-05 MED ORDER — WITCH HAZEL-GLYCERIN EX PADS: 1.0000 "application " | MEDICATED_PAD | CUTANEOUS | 11 refills | Status: DC | PRN

## 2020-06-05 MED ORDER — HYDROCORTISONE ACETATE 25 MG RE SUPP: 25.0000 mg | Freq: Two times a day (BID) | RECTAL | 2 refills | Status: DC | PRN

## 2020-06-05 MED FILL — HYDROCORTISONE AC 25 MG SUP: 25 | 12 days supply | Qty: 24 | Fill #0

## 2020-06-05 NOTE — Progress Notes (Signed)
    Procedures performed today:    None.  Independent interpretation of notes and tests performed by another provider:   None.  Brief History, Exam, Impression, and Recommendations:    Rectal bleeding Andre Cohen is a pleasant 49 year old male, for 1-1/2 to 2 weeks now he has had bright red blood per rectum when wiping. He went on vacation, and had some constipation and hard stools, his stools improved but he has continued to have significant blood. No pain. Abdominal exam is benign, rectal exam shows several external nonbleeding hemorrhoids, fecal occult blood testing is positive, good anal tone. We will start with increased fiber, stool softeners, Anusol HC suppositories and which hazel wipes. He is due for colon cancer screening anyway so I am going to go and have him see Dr. Bryan Lemma in 3 to 4 weeks.    ___________________________________________ Gwen Her. Dianah Field, M.D., ABFM., CAQSM. Primary Care and [place] Instructor of [place] [facility]

## 2020-06-05 NOTE — Addendum Note (Signed)
Addended by: Andria Rhein L on: 06/05/2020 11:06 AM   Modules accepted: Orders

## 2020-06-05 NOTE — Assessment & Plan Note (Signed)
Andre Cohen is a pleasant 49 year old male, for 1-1/2 to 2 weeks now he has had bright red blood per rectum when wiping. He went on vacation, and had some constipation and hard stools, his stools improved but he has continued to have significant blood. No pain. Abdominal exam is benign, rectal exam shows several external nonbleeding hemorrhoids, fecal occult blood testing is positive, good anal tone. We will start with increased fiber, stool softeners, Anusol HC suppositories and which hazel wipes. He is due for colon cancer screening anyway so I am going to go and have him see Dr. Bryan Lemma in 3 to 4 weeks.

## 2020-06-06 ENCOUNTER — Ambulatory Visit: Payer: BC Managed Care – PPO | Attending: Internal Medicine

## 2020-06-06 DIAGNOSIS — Z23 Encounter for immunization: Secondary | ICD-10-CM

## 2020-06-06 NOTE — Progress Notes (Signed)
   Covid-19 Vaccination Clinic  Name:  Andre Cohen    MRN: [id number] DOB: 1970/11/07  06/06/2020  Mr. Forget was observed post Covid-19 immunization for 15 minutes without incident. He was provided with Vaccine Information Sheet and instruction to access the V-Safe system.   [name] was instructed to call 911 with any severe reactions post vaccine: Marland Kitchen Difficulty breathing  . Swelling of face and throat  . A fast heartbeat  . A bad rash all over body  . Dizziness and weakness   Immunizations Administered    Name Date Dose VIS Date Route   Pfizer COVID-19 Vaccine 06/06/2020 10:42 AM 0.3 mL 04/08/2020 Intramuscular   Manufacturer: [place]   Lot: [id number]   [place]: [id number]

## 2020-07-13 MED FILL — ATORVASTATIN CALCIUM 10 MG: 10 | 90 days supply | Qty: 90 | Fill #2

## 2020-07-13 MED FILL — valACYclovir HCL 1 GM TABS: 1 | 90 days supply | Qty: 90 | Fill #3

## 2020-07-13 MED FILL — NAPROXEN 500 MG TABLET: 500 | 90 days supply | Qty: 180 | Fill #1

## 2020-08-07 ENCOUNTER — Encounter: Payer: Self-pay | Admitting: Gastroenterology

## 2020-09-03 ENCOUNTER — Other Ambulatory Visit (HOSPITAL_COMMUNITY): Payer: Self-pay | Admitting: Physician Assistant

## 2020-09-03 DIAGNOSIS — M2550 Pain in unspecified joint: Secondary | ICD-10-CM | POA: Diagnosis not present

## 2020-09-03 DIAGNOSIS — M0579 Rheumatoid arthritis with rheumatoid factor of multiple sites without organ or systems involvement: Secondary | ICD-10-CM | POA: Diagnosis not present

## 2020-09-03 DIAGNOSIS — R7989 Other specified abnormal findings of blood chemistry: Secondary | ICD-10-CM | POA: Diagnosis not present

## 2020-09-03 DIAGNOSIS — E790 Hyperuricemia without signs of inflammatory arthritis and tophaceous disease: Secondary | ICD-10-CM | POA: Diagnosis not present

## 2020-09-18 ENCOUNTER — Other Ambulatory Visit (HOSPITAL_COMMUNITY): Payer: Self-pay | Admitting: Gastroenterology

## 2020-09-18 ENCOUNTER — Encounter: Payer: Self-pay | Admitting: Gastroenterology

## 2020-09-18 ENCOUNTER — Ambulatory Visit (INDEPENDENT_AMBULATORY_CARE_PROVIDER_SITE_OTHER): Payer: BC Managed Care – PPO | Admitting: Gastroenterology

## 2020-09-18 ENCOUNTER — Other Ambulatory Visit: Payer: Self-pay

## 2020-09-18 VITALS — BP 148/90 | HR 79 | Ht 73.0 in | Wt 219.5 lb

## 2020-09-18 DIAGNOSIS — Z1211 Encounter for screening for malignant neoplasm of colon: Secondary | ICD-10-CM | POA: Diagnosis not present

## 2020-09-18 DIAGNOSIS — K921 Melena: Secondary | ICD-10-CM | POA: Diagnosis not present

## 2020-09-18 MED ORDER — SUPREP BOWEL PREP KIT 17.5-3.13-1.6 GM/177ML PO SOLN: 1.0000 | ORAL | 0 refills | Status: DC

## 2020-09-18 NOTE — Progress Notes (Signed)
Chief Complaint: Hematochezia  Referring Provider:     Aundria Mems, MD  HPI:     Andre Cohen is a 50 y.o. male with history of OSA (on CPAP), RA (well controlled on Humira), asthma, hyperlipidemia, referred to the Gastroenterology Clinic for evaluation of hematochezia.  New onset BRBPR starting in 05/2020. No rectal pain, abdominal pain.  Seen by his PCM on 06/05/2020 for this issue with exam notable for external nonbleeding hemorrhoids and FOBT positive.  Was treated with increased dietary fiber, stool softeners, Anusol suppositories. Sxs resolved and have not recurred.   Today, he states that while symptoms have resolved with conservative management, he is certainly concerned about the etiology for hematochezia and would very much like to proceed with colonoscopy.  No previous EGD or colonoscopy.    No recent labs or abdominal imaging for review today.  Sister with colon polyps, but no known family history of CRC, GI malignancy, liver disease, pancreatic disease, or IBD.    Past Medical History:  Diagnosis Date  . Asthma   . Carpal tunnel syndrome on both sides   . LFT elevation    VITAMIN USE IN THE PAST  . RA (rheumatoid arthritis) ([place])   . Sleep apnea    wear CPAP nightly     Past Surgical History:  Procedure Laterality Date  . CARPAL TUNNEL RELEASE Right 04/15/2016   Procedure: CARPAL TUNNEL RELEASE, right;  Surgeon: Daryll Brod, MD;  Location: [place];  Service: Orthopedics;  Laterality: Right;  FAB  . WISDOM TOOTH EXTRACTION     Family History  Problem Relation Age of Onset  . Hypertension Mother   . Hypertension Father   . Hypertension Sister   . Colon polyps Sister   . Hypertension Brother   . Cancer Brother        skin  . Colon cancer Neg Hx   . Esophageal cancer Neg Hx    Social History   Tobacco Use  . Smoking status: Never Smoker  . Smokeless tobacco: Never Used  Vaping Use  . Vaping Use:  Never used  Substance Use Topics  . Alcohol use: Yes    Comment: social  . Drug use: No   Current Outpatient Medications  Medication Sig Dispense Refill  . Adalimumab (HUMIRA PEN) 40 MG/0.4ML PNKT Inject 40 mg into the skin every 14 (fourteen) days. 2 each 5  . AMBULATORY NON FORMULARY MEDICATION Continuous positive airway pressure (CPAP) machine on auto pressure titration, if unable the set at 12 cm of H2O pressure, with all supplemental supplies as needed. 1 each 0  . atorvastatin (LIPITOR) 10 MG tablet TAKE 1 TABLET BY MOUTH DAILY. 90 tablet 3  . naproxen (NAPROSYN) 500 MG tablet 500 mg as needed (about 5 days a week).    . valACYclovir (VALTREX) 1000 MG tablet Take 1 tablet (1,000 mg total) by mouth daily as needed. 90 tablet 3  . albuterol (PROVENTIL HFA;VENTOLIN HFA) 108 (90 BASE) MCG/ACT inhaler Inhale 2 puffs into the lungs every 6 (six) hours as needed for wheezing or shortness of breath. (Patient not taking: Reported on 09/18/2020) 1 Inhaler 2  . hydrocortisone (ANUSOL-HC) 25 MG suppository Place 1 suppository (25 mg total) rectally 2 (two) times daily as needed for hemorrhoids. (Patient not taking: Reported on 09/18/2020) 24 suppository 2  . ipratropium (ATROVENT) 0.06 % nasal spray Place 2 sprays into both nostrils 4 (four) times daily.  For 1 week (Patient not taking: Reported on 09/18/2020) 15 mL 1   No current facility-administered medications for this visit.   Allergies  Allergen Reactions  . Doxycycline Rash     Review of Systems: All systems reviewed and negative except where noted in HPI.     Physical Exam:    Wt Readings from Last 3 Encounters:  09/18/20 219 lb 8 oz (99.6 kg)  11/12/19 238 lb 4.8 oz (108.1 kg)  11/19/18 220 lb (99.8 kg)    BP (!) 148/90   Pulse 79   Ht 6\' 1"  (1.854 m)   Wt 219 lb 8 oz (99.6 kg)   BMI 28.96 kg/m  Constitutional:  Pleasant, in no acute distress. Psychiatric: Normal mood and affect. Behavior is normal. EENT: Pupils normal.   Conjunctivae are normal. No scleral icterus. Neck supple. No cervical LAD. Cardiovascular: Normal rate, regular rhythm. No edema Pulmonary/chest: Effort normal and breath sounds normal. No wheezing, rales or rhonchi. Abdominal: Soft, nondistended, nontender. Bowel sounds active throughout. There are no masses palpable. No hepatomegaly. Neurological: Alert and oriented to person place and time. Skin: Skin is warm and dry. No rashes noted. Rectal: Exam deferred to time of colonoscopy.    ASSESSMENT AND PLAN;   1) Hematochezia -Plan for colonoscopy to evaluate for more proximal etiology of recent bleeding -Continue conservative measures as needed  2) Colon cancer screening -no previous colonoscopy -Colonoscopy routine CRC screening  The indications, risks, and benefits of colonoscopy were explained to the patient in detail. Risks include but are not limited to bleeding, perforation, adverse reaction to medications, and cardiopulmonary compromise. Sequelae include but are not limited to the possibility of surgery, hospitalization, and mortality. The patient verbalized understanding and wished to proceed. All questions answered, referred to the scheduler and bowel prep ordered. Further recommendations pending results of the exam.      Lavena Bullion, DO, FACG  09/18/2020, 11:21 AM   Silverio Decamp,*

## 2020-09-18 NOTE — Patient Instructions (Addendum)
If you are age 52 or older, your body mass index should be between 23-30. Your Body mass index is 28.96 kg/m. If this is out of the aforementioned range listed, please consider follow up with your Primary Care Provider.  If you are age 23 or younger, your body mass index should be between 19-25. Your Body mass index is 28.96 kg/m. If this is out of the aformentioned range listed, please consider follow up with your Primary Care Provider.    We have sent the following medications to your pharmacy for you to pick up at your convenience:  Suprep  Due to recent changes in healthcare laws, you may see the results of your imaging and laboratory studies on MyChart before your provider has had a chance to review them.  We understand that in some cases there may be results that are confusing or concerning to you. Not all laboratory results come back in the same time frame and the provider may be waiting for multiple results in order to interpret others.  Please give [place] 48 hours in order for your provider to thoroughly review all the results before contacting the office for clarification of your results.   Thank you for choosing me and [facility].  Vito Cirigliano, D.O.

## 2020-10-01 ENCOUNTER — Other Ambulatory Visit (HOSPITAL_COMMUNITY): Payer: Self-pay

## 2020-10-01 MED FILL — Sod Sulfate-Pot Sulf-Mg Sulf Oral Sol 17.5-3.13-1.6 GM/177ML: ORAL | 1 days supply | Qty: 354 | Fill #0 | Status: AC

## 2020-10-18 ENCOUNTER — Other Ambulatory Visit: Payer: Self-pay | Admitting: Sports Medicine

## 2020-10-18 MED FILL — Atorvastatin Calcium Tab 10 MG (Base Equivalent): ORAL | 90 days supply | Qty: 90 | Fill #0 | Status: AC

## 2020-10-19 ENCOUNTER — Other Ambulatory Visit (HOSPITAL_COMMUNITY): Payer: Self-pay

## 2020-10-19 MED ORDER — VALACYCLOVIR HCL 1 G PO TABS
1000.0000 mg | ORAL_TABLET | Freq: Every day | ORAL | 3 refills | Status: DC | PRN
  Filled 2020-10-19: qty 90, 90d supply, fill #0
  Filled 2021-01-21: qty 90, 90d supply, fill #1
  Filled 2021-05-03: qty 90, 90d supply, fill #2
  Filled 2021-08-02: qty 90, 90d supply, fill #3

## 2020-10-30 ENCOUNTER — Encounter: Payer: Self-pay | Admitting: Gastroenterology

## 2020-11-13 ENCOUNTER — Ambulatory Visit (AMBULATORY_SURGERY_CENTER): Payer: BC Managed Care – PPO | Admitting: Gastroenterology

## 2020-11-13 ENCOUNTER — Other Ambulatory Visit: Payer: Self-pay

## 2020-11-13 ENCOUNTER — Encounter: Payer: Self-pay | Admitting: Gastroenterology

## 2020-11-13 VITALS — BP 123/76 | HR 59 | Temp 97.5°F | Resp 13 | Ht 73.0 in | Wt 219.0 lb

## 2020-11-13 DIAGNOSIS — R195 Other fecal abnormalities: Secondary | ICD-10-CM | POA: Diagnosis not present

## 2020-11-13 DIAGNOSIS — D122 Benign neoplasm of ascending colon: Secondary | ICD-10-CM

## 2020-11-13 DIAGNOSIS — D120 Benign neoplasm of cecum: Secondary | ICD-10-CM | POA: Diagnosis not present

## 2020-11-13 DIAGNOSIS — K921 Melena: Secondary | ICD-10-CM

## 2020-11-13 DIAGNOSIS — K5730 Diverticulosis of large intestine without perforation or abscess without bleeding: Secondary | ICD-10-CM

## 2020-11-13 DIAGNOSIS — K641 Second degree hemorrhoids: Secondary | ICD-10-CM

## 2020-11-13 DIAGNOSIS — Z1211 Encounter for screening for malignant neoplasm of colon: Secondary | ICD-10-CM | POA: Diagnosis not present

## 2020-11-13 MED ORDER — SODIUM CHLORIDE 0.9 % IV SOLN: 500.0000 mL | Freq: Once | INTRAVENOUS | Status: DC

## 2020-11-13 NOTE — Progress Notes (Signed)
A and O x3. Report to RN. Tolerated MAC anesthesia well.

## 2020-11-13 NOTE — Progress Notes (Signed)
VS- Andre Cohen

## 2020-11-13 NOTE — Op Note (Signed)
[name] Procedure Date: 11/13/2020 8:17 AM MRN: [id number] Endoscopist: Gerrit Heck , MD Age: 50 Referring MD:  Date of Birth: 11-18-1970 Gender: Male Account #: [id number] Procedure:                Colonoscopy Indications:              This is the patient's first colonoscopy. Recent                            episodic hematochezia and heme positive stool. Medicines:                Monitored Anesthesia Care Procedure:                Pre-Anesthesia Assessment:                           - Prior to the procedure, a History and Physical                            was performed, and patient medications and                            allergies were reviewed. The patient's tolerance of                            previous anesthesia was also reviewed. The risks                            and benefits of the procedure and the sedation                            options and risks were discussed with the patient.                            All questions were answered, and informed consent                            was obtained. Prior Anticoagulants: The patient has                            taken no previous anticoagulant or antiplatelet                            agents. ASA Grade Assessment: II - A patient with                            mild systemic disease. After reviewing the risks                            and benefits, the patient was deemed in                            satisfactory condition to undergo the procedure.  After obtaining informed consent, the colonoscope                            was passed under direct vision. Throughout the                            procedure, the patient's blood pressure, pulse, and                            oxygen saturations were monitored continuously. The                            Olympus CF-HQ190 [id number]) Colonoscope was                            introduced through the  anus and advanced to the the                            cecum, identified by appendiceal orifice and                            ileocecal valve. The colonoscopy was technically                            difficult and complex due to significant looping.                            Successful completion of the procedure was aided by                            using manual pressure. The patient tolerated the                            procedure well. The quality of the bowel                            preparation was good. The ileocecal valve,                            appendiceal orifice, and rectum were photographed. Scope In: 8:28:31 AM Scope Out: 8:52:14 AM Scope Withdrawal Time: 0 hours 11 minutes 0 seconds  Total Procedure Duration: 0 hours 23 minutes 43 seconds  Findings:                 Skin tags were found on perianal exam.                           Two sessile polyps were found in the ascending                            colon and cecum. The polyps were 2 to 3 mm in size.                            These polyps were  removed with a cold biopsy                            forceps. Resection and retrieval were complete.                            Estimated blood loss was minimal.                           Non-bleeding internal hemorrhoids were found during                            retroflexion. The hemorrhoids were medium-sized and                            Grade II (internal hemorrhoids that prolapse but                            reduce spontaneously).                           The ascending colon revealed moderately excessive                            looping. Advancing the scope required using manual                            pressure. Complications:            No immediate complications. Estimated Blood Loss:     Estimated blood loss was minimal. Impression:               - Perianal skin tags found on perianal exam.                           - Two 2 to 3 mm polyps in the  ascending colon and                            in the cecum, removed with a cold biopsy forceps.                            Resected and retrieved.                           - Non-bleeding internal hemorrhoids.                           - There was significant looping of the colon. Recommendation:           - Patient has a contact number available for                            emergencies. The signs and symptoms of potential                            delayed complications were discussed with the  patient. Return to normal activities tomorrow.                            Written discharge instructions were provided to the                            patient.                           - Resume previous diet.                           - Continue present medications.                           - Await pathology results.                           - Repeat colonoscopy for surveillance based on                            pathology results.                           - Return to GI office PRN.                           - Use fiber, for example Citrucel, Fibercon, Konsyl                            or Metamucil.                           - Internal hemorrhoids were noted on this study and                            may be amenable to hemorrhoid band ligation. If you                            are interested in further treatment of these                            hemorrhoids with band ligation, please contact my                            clinic to set up an appointment for evaluation and                            treatment. Gerrit Heck, MD 11/13/2020 9:00:43 AM

## 2020-11-13 NOTE — Patient Instructions (Signed)
YOU HAD AN ENDOSCOPIC PROCEDURE TODAY AT THE [facility]:   Refer to the procedure report that was given to you for any specific questions about what was found during the examination.  If the procedure report does not answer your questions, please call your gastroenterologist to clarify.  If you requested that your care partner not be given the details of your procedure findings, then the procedure report has been included in a sealed envelope for you to review at your convenience later.  YOU SHOULD EXPECT: Some feelings of bloating in the abdomen. Passage of more gas than usual.  Walking can help get rid of the air that was put into your GI tract during the procedure and reduce the bloating. If you had a lower endoscopy (such as a colonoscopy or flexible sigmoidoscopy) you may notice spotting of blood in your stool or on the toilet paper. If you underwent a bowel prep for your procedure, you may not have a normal bowel movement for a few days.  Please Note:  You might notice some irritation and congestion in your nose or some drainage.  This is from the oxygen used during your procedure.  There is no need for concern and it should clear up in a day or so.  SYMPTOMS TO REPORT IMMEDIATELY:   Following lower endoscopy (colonoscopy or flexible sigmoidoscopy):  Excessive amounts of blood in the stool  Significant tenderness or worsening of abdominal pains  Swelling of the abdomen that is new, acute  Fever of 100F or higher   Following upper endoscopy (EGD)  Vomiting of blood or coffee ground material  New chest pain or pain under the shoulder blades  Painful or persistently difficult swallowing  New shortness of breath  Fever of 100F or higher  Black, tarry-looking stools  For urgent or emergent issues, a gastroenterologist can be reached at any hour by calling [phone]. Do not use MyChart messaging for urgent concerns.    DIET:  We do recommend a small meal at first, but  then you may proceed to your regular diet.  Drink plenty of fluids but you should avoid alcoholic beverages for 24 hours.  ACTIVITY:  You should plan to take it easy for the rest of today and you should NOT DRIVE or use heavy machinery until tomorrow (because of the sedation medicines used during the test).    FOLLOW UP: Our staff will call the number listed on your records 48-72 hours following your procedure to check on you and address any questions or concerns that you may have regarding the information given to you following your procedure. If we do not reach you, we will leave a message.  We will attempt to reach you two times.  During this call, we will ask if you have developed any symptoms of COVID 19. If you develop any symptoms (ie: fever, flu-like symptoms, shortness of breath, cough etc.) before then, please call [phone].  If you test positive for Covid 19 in the 2 weeks post procedure, please call and report this information to us.    If any biopsies were taken you will be contacted by phone or by letter within the next 1-3 weeks.  Please call us at [phone] if you have not heard about the biopsies in 3 weeks.    SIGNATURES/CONFIDENTIALITY: You and/or your care partner have signed paperwork which will be entered into your electronic medical record.  These signatures attest to the fact that that the information above on   your After Visit Summary has been reviewed and is understood.  Full responsibility of the confidentiality of this discharge information lies with you and/or your care-partner. 

## 2020-11-13 NOTE — Progress Notes (Signed)
Called to room to assist during endoscopic procedure.  Patient ID and intended procedure confirmed with present staff. Received instructions for my participation in the procedure from the performing physician.  

## 2020-11-17 ENCOUNTER — Telehealth: Payer: Self-pay | Admitting: *Deleted

## 2020-11-17 ENCOUNTER — Telehealth: Payer: Self-pay

## 2020-11-17 NOTE — Telephone Encounter (Signed)
  Follow up Call-  Call back number 11/13/2020  Post procedure Call Back phone  # [phone]  Permission to leave phone message Yes  Some recent data might be hidden     Patient questions:  Do you have a fever, pain , or abdominal swelling? No. Pain Score  0 *  Have you tolerated food without any problems? Yes.    Have you been able to return to your normal activities? Yes.    Do you have any questions about your discharge instructions: Diet   No. Medications  No. Follow up visit  No.  Do you have questions or concerns about your Care? No.  Actions: * If pain score is 4 or above: No action needed, pain <4.  1. Have you developed a fever since your procedure? no  2.   Have you had an respiratory symptoms (SOB or cough) since your procedure? no  3.   Have you tested positive for COVID 19 since your procedure no  4.   Have you had any family members/close contacts diagnosed with the COVID 19 since your procedure?  no   If yes to any of these questions please route to Joylene John, RN and Joella Prince, RN

## 2020-11-17 NOTE — Telephone Encounter (Signed)
Attempted to reach patient for post-procedure call. No answer. Left message that staff will make another attempt to reach him later today and for him to please not hesitate to call us if he has any questions/concerns regarding his care.

## 2020-11-25 ENCOUNTER — Telehealth: Payer: Self-pay

## 2020-11-25 NOTE — Telephone Encounter (Signed)
Called patient to relay information about pathology report from most recent colonoscopy.  2 Tubular adenomas were found and guidelines call for a 7 year colon recall.  Spoke with the patient's wife Leveda Anna and provided this information as well as that our office will reach out about scheduling future procedure closer to time.  Mrs. Alvillar verbalized understanding.  RN will forward information to the patient's PCP.

## 2020-12-10 ENCOUNTER — Ambulatory Visit (INDEPENDENT_AMBULATORY_CARE_PROVIDER_SITE_OTHER): Payer: BC Managed Care – PPO | Admitting: Sports Medicine

## 2020-12-10 ENCOUNTER — Ambulatory Visit (INDEPENDENT_AMBULATORY_CARE_PROVIDER_SITE_OTHER): Payer: BC Managed Care – PPO

## 2020-12-10 ENCOUNTER — Other Ambulatory Visit (HOSPITAL_COMMUNITY): Payer: Self-pay

## 2020-12-10 ENCOUNTER — Other Ambulatory Visit: Payer: Self-pay

## 2020-12-10 DIAGNOSIS — M545 Low back pain, unspecified: Secondary | ICD-10-CM | POA: Diagnosis not present

## 2020-12-10 DIAGNOSIS — S39012A Strain of muscle, fascia and tendon of lower back, initial encounter: Secondary | ICD-10-CM | POA: Insufficient documentation

## 2020-12-10 MED ORDER — PREDNISONE 50 MG PO TABS
50.0000 mg | ORAL_TABLET | Freq: Every day | ORAL | 0 refills | Status: DC
  Filled 2020-12-10: qty 5, 5d supply, fill #0

## 2020-12-10 NOTE — Progress Notes (Signed)
    Procedures performed today:    None.  Independent interpretation of notes and tests performed by another provider:   None.  Brief History, Exam, Impression, and Recommendations:    Lumbar strain Did some activities out of the usual over the past week, increasing pain in the left low back, not worse with sitting, flexion, Valsalva, nothing radicular, no red flag symptoms, no constitutional symptoms. Starting to improve, this is likely a lumbar strain, adding some x-rays, 5 days of prednisone, core rehab. Return to see me as needed.    ___________________________________________ Gwen Her. Dianah Field, M.D., ABFM., CAQSM. Primary Care and [place] Instructor of [place] of [facility] of Medicine

## 2020-12-10 NOTE — Assessment & Plan Note (Signed)
Did some activities out of the usual over the past week, increasing pain in the left low back, not worse with sitting, flexion, Valsalva, nothing radicular, no red flag symptoms, no constitutional symptoms. Starting to improve, this is likely a lumbar strain, adding some x-rays, 5 days of prednisone, core rehab. Return to see me as needed.

## 2020-12-16 ENCOUNTER — Other Ambulatory Visit: Payer: Self-pay

## 2020-12-17 ENCOUNTER — Other Ambulatory Visit (HOSPITAL_COMMUNITY): Payer: Self-pay

## 2020-12-17 MED ORDER — NAPROXEN 500 MG PO TABS
500.0000 mg | ORAL_TABLET | Freq: Every day | ORAL | 1 refills | Status: DC
  Filled 2020-12-17: qty 90, 90d supply, fill #0
  Filled 2021-03-14: qty 90, 90d supply, fill #1

## 2021-01-21 ENCOUNTER — Other Ambulatory Visit: Payer: Self-pay | Admitting: Sports Medicine

## 2021-01-22 ENCOUNTER — Other Ambulatory Visit (HOSPITAL_COMMUNITY): Payer: Self-pay

## 2021-01-22 ENCOUNTER — Other Ambulatory Visit: Payer: Self-pay | Admitting: Sports Medicine

## 2021-01-22 MED ORDER — ATORVASTATIN CALCIUM 10 MG PO TABS
10.0000 mg | ORAL_TABLET | Freq: Every day | ORAL | 3 refills | Status: DC
  Filled 2021-01-22: qty 90, 90d supply, fill #0
  Filled 2021-05-03: qty 90, 90d supply, fill #1
  Filled 2021-08-02: qty 90, 90d supply, fill #2
  Filled 2021-10-29: qty 90, 90d supply, fill #3

## 2021-01-27 DIAGNOSIS — L820 Inflamed seborrheic keratosis: Secondary | ICD-10-CM | POA: Diagnosis not present

## 2021-01-27 DIAGNOSIS — D485 Neoplasm of uncertain behavior of skin: Secondary | ICD-10-CM | POA: Diagnosis not present

## 2021-01-27 DIAGNOSIS — L821 Other seborrheic keratosis: Secondary | ICD-10-CM | POA: Diagnosis not present

## 2021-01-27 DIAGNOSIS — D235 Other benign neoplasm of skin of trunk: Secondary | ICD-10-CM | POA: Diagnosis not present

## 2021-02-24 DIAGNOSIS — E790 Hyperuricemia without signs of inflammatory arthritis and tophaceous disease: Secondary | ICD-10-CM | POA: Diagnosis not present

## 2021-02-24 DIAGNOSIS — M0579 Rheumatoid arthritis with rheumatoid factor of multiple sites without organ or systems involvement: Secondary | ICD-10-CM | POA: Diagnosis not present

## 2021-02-24 DIAGNOSIS — M2550 Pain in unspecified joint: Secondary | ICD-10-CM | POA: Diagnosis not present

## 2021-02-24 DIAGNOSIS — R7989 Other specified abnormal findings of blood chemistry: Secondary | ICD-10-CM | POA: Diagnosis not present

## 2021-03-15 ENCOUNTER — Other Ambulatory Visit (HOSPITAL_COMMUNITY): Payer: Self-pay

## 2021-05-03 ENCOUNTER — Other Ambulatory Visit (HOSPITAL_COMMUNITY): Payer: Self-pay

## 2021-05-26 DIAGNOSIS — L820 Inflamed seborrheic keratosis: Secondary | ICD-10-CM | POA: Diagnosis not present

## 2021-05-26 DIAGNOSIS — D225 Melanocytic nevi of trunk: Secondary | ICD-10-CM | POA: Diagnosis not present

## 2021-05-26 DIAGNOSIS — D235 Other benign neoplasm of skin of trunk: Secondary | ICD-10-CM | POA: Diagnosis not present

## 2021-06-09 DIAGNOSIS — D235 Other benign neoplasm of skin of trunk: Secondary | ICD-10-CM | POA: Diagnosis not present

## 2021-06-09 DIAGNOSIS — G4733 Obstructive sleep apnea (adult) (pediatric): Secondary | ICD-10-CM | POA: Diagnosis not present

## 2021-06-22 ENCOUNTER — Other Ambulatory Visit (HOSPITAL_COMMUNITY): Payer: Self-pay

## 2021-07-08 ENCOUNTER — Other Ambulatory Visit (HOSPITAL_COMMUNITY): Payer: Self-pay

## 2021-07-09 ENCOUNTER — Other Ambulatory Visit (HOSPITAL_COMMUNITY): Payer: Self-pay

## 2021-07-09 MED ORDER — NAPROXEN 500 MG PO TABS
500.0000 mg | ORAL_TABLET | Freq: Every day | ORAL | 1 refills | Status: DC
  Filled 2021-07-09: qty 90, 90d supply, fill #0
  Filled 2021-11-09: qty 90, 90d supply, fill #1

## 2021-07-10 DIAGNOSIS — G4733 Obstructive sleep apnea (adult) (pediatric): Secondary | ICD-10-CM | POA: Diagnosis not present

## 2021-07-13 ENCOUNTER — Other Ambulatory Visit: Payer: Self-pay

## 2021-07-13 ENCOUNTER — Ambulatory Visit: Payer: BC Managed Care – PPO | Admitting: Sports Medicine

## 2021-07-13 VITALS — BP 151/95 | HR 77 | Wt 231.0 lb

## 2021-07-13 DIAGNOSIS — R110 Nausea: Secondary | ICD-10-CM

## 2021-07-13 DIAGNOSIS — H8110 Benign paroxysmal vertigo, unspecified ear: Secondary | ICD-10-CM | POA: Insufficient documentation

## 2021-07-13 DIAGNOSIS — H8113 Benign paroxysmal vertigo, bilateral: Secondary | ICD-10-CM | POA: Diagnosis not present

## 2021-07-13 MED ORDER — DIAZEPAM 5 MG PO TABS: 5.0000 mg | ORAL_TABLET | Freq: Three times a day (TID) | ORAL | 0 refills | Status: DC | PRN

## 2021-07-13 MED ORDER — PREDNISONE 50 MG PO TABS: 50.0000 mg | ORAL_TABLET | Freq: Every day | ORAL | 0 refills | Status: DC

## 2021-07-13 MED ORDER — PROMETHAZINE HCL 25 MG/ML IJ SOLN
25.0000 mg | Freq: Once | INTRAMUSCULAR | Status: AC
  Administered 2021-07-13: 17:00:00 25 mg via INTRAMUSCULAR

## 2021-07-13 NOTE — Assessment & Plan Note (Signed)
Andre Cohen is a pleasant 51 year old male, he gets motion sickness very easily, more recently he has had increasing sensation of nausea, motion sickness, occurs predominantly with angular movement of his head. No acute changes in hearing, no tinnitus, no pressure sensation. No focal neurologic symptoms or signs. Dix-Hallpike maneuver today did not reproduce nystagmus but it did reproduce significant nausea. His wife gave him some meclizine which seemed to help. He has had this in the past and did the Epley maneuver himself at home a few times this also seem to help significantly, adding 5 days of prednisone, he can continue meclizine, adding Valium, as he is very nauseated right now we will add a shot of Phenergan 25 intramuscular. He will continue the Epley maneuver and return to see me in a week or 2 at which point we can do an annual physical and get some labs.

## 2021-07-13 NOTE — Progress Notes (Signed)
° ° °  Procedures performed today:    Phenergan 25 given intramuscular today.  Independent interpretation of notes and tests performed by another provider:   None.  Brief History, Exam, Impression, and Recommendations:    Benign paroxysmal positional vertigo Andre Cohen is a pleasant 51 year old male, he gets motion sickness very easily, more recently he has had increasing sensation of nausea, motion sickness, occurs predominantly with angular movement of his head. No acute changes in hearing, no tinnitus, no pressure sensation. No focal neurologic symptoms or signs. Dix-Hallpike maneuver today did not reproduce nystagmus but it did reproduce significant nausea. His wife gave him some meclizine which seemed to help. He has had this in the past and did the Epley maneuver himself at home a few times this also seem to help significantly, adding 5 days of prednisone, he can continue meclizine, adding Valium, as he is very nauseated right now we will add a shot of Phenergan 25 intramuscular. He will continue the Epley maneuver and return to see me in a week or 2 at which point we can do an annual physical and get some labs.   ___________________________________________ Gwen Her. Dianah Field, M.D., ABFM., CAQSM. Primary Care and [place] Instructor of [place] of [facility] of Medicine

## 2021-07-13 NOTE — Addendum Note (Signed)
Addended by: Gust Brooms on: 07/13/2021 04:43 PM   Modules accepted: Orders

## 2021-07-28 ENCOUNTER — Encounter: Payer: BC Managed Care – PPO | Admitting: Sports Medicine

## 2021-08-02 ENCOUNTER — Other Ambulatory Visit (HOSPITAL_COMMUNITY): Payer: Self-pay

## 2021-08-04 ENCOUNTER — Encounter: Payer: BC Managed Care – PPO | Admitting: Sports Medicine

## 2021-08-10 DIAGNOSIS — G4733 Obstructive sleep apnea (adult) (pediatric): Secondary | ICD-10-CM | POA: Diagnosis not present

## 2021-08-12 DIAGNOSIS — Z6833 Body mass index (BMI) 33.0-33.9, adult: Secondary | ICD-10-CM | POA: Insufficient documentation

## 2021-08-12 DIAGNOSIS — R7989 Other specified abnormal findings of blood chemistry: Secondary | ICD-10-CM | POA: Insufficient documentation

## 2021-08-13 ENCOUNTER — Ambulatory Visit (INDEPENDENT_AMBULATORY_CARE_PROVIDER_SITE_OTHER): Payer: BC Managed Care – PPO | Admitting: Sports Medicine

## 2021-08-13 ENCOUNTER — Other Ambulatory Visit: Payer: Self-pay

## 2021-08-13 ENCOUNTER — Other Ambulatory Visit (HOSPITAL_COMMUNITY): Payer: Self-pay

## 2021-08-13 VITALS — BP 151/107 | HR 80 | Wt 238.0 lb

## 2021-08-13 DIAGNOSIS — I10 Essential (primary) hypertension: Secondary | ICD-10-CM | POA: Insufficient documentation

## 2021-08-13 DIAGNOSIS — R972 Elevated prostate specific antigen [PSA]: Secondary | ICD-10-CM

## 2021-08-13 DIAGNOSIS — G4730 Sleep apnea, unspecified: Secondary | ICD-10-CM | POA: Diagnosis not present

## 2021-08-13 DIAGNOSIS — Z0000 Encounter for general adult medical examination without abnormal findings: Secondary | ICD-10-CM | POA: Diagnosis not present

## 2021-08-13 DIAGNOSIS — E7849 Other hyperlipidemia: Secondary | ICD-10-CM | POA: Diagnosis not present

## 2021-08-13 DIAGNOSIS — Z8249 Family history of ischemic heart disease and other diseases of the circulatory system: Secondary | ICD-10-CM

## 2021-08-13 MED ORDER — AMLODIPINE BESYLATE 5 MG PO TABS
5.0000 mg | ORAL_TABLET | Freq: Every day | ORAL | 3 refills | Status: DC
  Filled 2021-08-13: qty 90, 90d supply, fill #0

## 2021-08-13 MED ORDER — AMBULATORY NON FORMULARY MEDICATION: 0 refills | Status: AC

## 2021-08-13 NOTE — Assessment & Plan Note (Signed)
Obstructive sleep apnea, machine is 53 years old, it sounds like it is making a lot of noise and waking him up, prescription written for new CPAP machine.

## 2021-08-13 NOTE — Assessment & Plan Note (Signed)
Annual physical as above, routine labs ordered, ECG baseline per patient request. Return to see me in a year for this.

## 2021-08-13 NOTE — Addendum Note (Signed)
Addended by: Gust Brooms on: 08/13/2021 03:01 PM   Modules accepted: Orders

## 2021-08-13 NOTE — Assessment & Plan Note (Signed)
Starting amlodipine 5 mg daily. His wife can check his blood pressures at home.

## 2021-08-13 NOTE — Progress Notes (Signed)
Subjective:    CC: Annual Physical Exam  HPI:  This patient is here for their annual physical  I reviewed the past medical history, family history, social history, surgical history, and allergies today and no changes were needed.  Please see the problem list section below in epic for further details.  Past Medical History: Past Medical History:  Diagnosis Date   Asthma    Carpal tunnel syndrome on both sides    Hyperlipidemia    LFT elevation    VITAMIN USE IN THE PAST   RA (rheumatoid arthritis) ([place])    Sleep apnea    wear CPAP nightly   Past Surgical History: Past Surgical History:  Procedure Laterality Date   CARPAL TUNNEL RELEASE Right 04/15/2016   Procedure: CARPAL TUNNEL RELEASE, right;  Surgeon: Daryll Brod, MD;  Location: [facility];  Service: Orthopedics;  Laterality: Right;  FAB   WISDOM TOOTH EXTRACTION     Social History: Social History   Socioeconomic History   Marital status: Married    Spouse name: Not on file   Number of children: Not on file   Years of education: Not on file   Highest education level: Not on file  Occupational History   Not on file  Tobacco Use   Smoking status: Never   Smokeless tobacco: Never  Vaping Use   Vaping Use: Never used  Substance and Sexual Activity   Alcohol use: Yes    Comment: social   Drug use: No   Sexual activity: Not on file  Other Topics Concern   Not on file  Social History Narrative   Not on file   Social Determinants of Health   Financial Resource Strain: Not on file  Food Insecurity: Not on file  Transportation Needs: Not on file  Physical Activity: Not on file  Stress: Not on file  Social Connections: Not on file   Family History: Family History  Problem Relation Age of Onset   Hypertension Mother    Hypertension Father    Hypertension Sister    Colon polyps Sister    Hypertension Brother    Cancer Brother        skin   Colon cancer Neg Hx    Esophageal cancer Neg Hx     Rectal cancer Neg Hx    Stomach cancer Neg Hx    Allergies: Allergies  Allergen Reactions   Doxycycline Rash   Medications: See med rec.  Review of Systems: No headache, visual changes, nausea, vomiting, diarrhea, constipation, dizziness, abdominal pain, skin rash, fevers, chills, night sweats, swollen lymph nodes, weight loss, chest pain, body aches, joint swelling, muscle aches, shortness of breath, mood changes, visual or auditory hallucinations.  Objective:    General: Well Developed, well nourished, and in no acute distress.  Neuro: Alert and oriented x3, extra-ocular muscles intact, sensation grossly intact. Cranial nerves II through XII are intact, motor, sensory, and coordinative functions are all intact. HEENT: Normocephalic, atraumatic, pupils equal round reactive to light, neck supple, no masses, no lymphadenopathy, thyroid nonpalpable. Oropharynx, nasopharynx, external ear canals are unremarkable. Skin: Warm and dry, no rashes noted.  Cardiac: Regular rate and rhythm, no murmurs rubs or gallops.  Respiratory: Clear to auscultation bilaterally. Not using accessory muscles, speaking in full sentences.  Abdominal: Soft, nontender, nondistended, positive bowel sounds, no masses, no organomegaly.  Musculoskeletal: Shoulder, elbow, wrist, hip, knee, ankle stable, and with full range of motion.  Twelve-lead ECG performed and interpreted by me, normal rate,  normal rhythm, no ST changes, no PR abnormalities, normal R wave progression, normal axis.  Impression and Recommendations:    The patient was counselled, risk factors were discussed, anticipatory guidance given.  Annual physical exam Annual physical as above, routine labs ordered, ECG baseline per patient request. Return to see me in a year for this.  Sleep apnea Obstructive sleep apnea, machine is 53 years old, it sounds like it is making a lot of noise and waking him up, prescription written for new CPAP  machine.  Benign essential hypertension Starting amlodipine 5 mg daily. His wife can check his blood pressures at home.   ___________________________________________ Gwen Her. Dianah Field, M.D., ABFM., CAQSM. Primary Care and Sports Medicine [facility] [facility]  Adjunct Professor of [place] of [facility] of Medicine

## 2021-08-16 LAB — CBC
HCT: 45.5 % (ref 38.5–50.0)
Hemoglobin: 14.9 g/dL (ref 13.2–17.1)
MCH: 31 pg (ref 27.0–33.0)
MCHC: 32.7 g/dL (ref 32.0–36.0)
MCV: 94.8 fL (ref 80.0–100.0)
MPV: 12.1 fL (ref 7.5–12.5)
Platelets: 264 10*3/uL (ref 140–400)
RBC: 4.8 10*6/uL (ref 4.20–5.80)
RDW: 13.1 % (ref 11.0–15.0)
WBC: 7.1 10*3/uL (ref 3.8–10.8)

## 2021-08-16 LAB — COMPREHENSIVE METABOLIC PANEL
AG Ratio: 1.6 (calc) (ref 1.0–2.5)
ALT: 70 U/L — ABNORMAL HIGH (ref 9–46)
AST: 36 U/L — ABNORMAL HIGH (ref 10–35)
Albumin: 4.7 g/dL (ref 3.6–5.1)
Alkaline phosphatase (APISO): 51 U/L (ref 35–144)
BUN: 15 mg/dL (ref 7–25)
CO2: 27 mmol/L (ref 20–32)
Calcium: 10 mg/dL (ref 8.6–10.3)
Chloride: 103 mmol/L (ref 98–110)
Creat: 0.85 mg/dL (ref 0.70–1.30)
Globulin: 3 g/dL (calc) (ref 1.9–3.7)
Glucose, Bld: 87 mg/dL (ref 65–99)
Potassium: 4.4 mmol/L (ref 3.5–5.3)
Sodium: 141 mmol/L (ref 135–146)
Total Bilirubin: 0.5 mg/dL (ref 0.2–1.2)
Total Protein: 7.7 g/dL (ref 6.1–8.1)

## 2021-08-16 LAB — LIPID PANEL
Cholesterol: 163 mg/dL (ref <200)
HDL: 50 mg/dL (ref >=40)
LDL Cholesterol (Calc): 97 mg/dL (calc)
Non-HDL Cholesterol (Calc): 113 mg/dL (calc) (ref <130)
Total CHOL/HDL Ratio: 3.3 (calc) (ref <5.0)
Triglycerides: 69 mg/dL (ref <150)

## 2021-08-16 LAB — TSH: TSH: 1.25 mIU/L (ref 0.40–4.50)

## 2021-08-16 LAB — HEMOGLOBIN A1C
Hgb A1c MFr Bld: 6 % of total Hgb — ABNORMAL HIGH (ref <5.7)
Mean Plasma Glucose: 126 mg/dL
eAG (mmol/L): 7 mmol/L

## 2021-08-16 LAB — PSA, TOTAL AND FREE
PSA, % Free: 45 % (calc) (ref >25)
PSA, Free: 0.5 ng/mL
PSA, Total: 1.1 ng/mL (ref <=4.0)

## 2021-10-20 DIAGNOSIS — G4733 Obstructive sleep apnea (adult) (pediatric): Secondary | ICD-10-CM | POA: Diagnosis not present

## 2021-10-25 ENCOUNTER — Encounter (INDEPENDENT_AMBULATORY_CARE_PROVIDER_SITE_OTHER): Payer: BC Managed Care – PPO | Admitting: Sports Medicine

## 2021-10-25 DIAGNOSIS — I10 Essential (primary) hypertension: Secondary | ICD-10-CM

## 2021-10-29 ENCOUNTER — Other Ambulatory Visit (HOSPITAL_COMMUNITY): Payer: Self-pay

## 2021-11-02 ENCOUNTER — Other Ambulatory Visit (HOSPITAL_COMMUNITY): Payer: Self-pay

## 2021-11-02 DIAGNOSIS — I861 Scrotal varices: Secondary | ICD-10-CM | POA: Diagnosis not present

## 2021-11-02 DIAGNOSIS — Z0000 Encounter for general adult medical examination without abnormal findings: Secondary | ICD-10-CM | POA: Diagnosis not present

## 2021-11-02 DIAGNOSIS — R002 Palpitations: Secondary | ICD-10-CM | POA: Diagnosis not present

## 2021-11-02 MED ORDER — VALSARTAN 80 MG PO TABS
80.0000 mg | ORAL_TABLET | Freq: Every day | ORAL | 3 refills | Status: DC
  Filled 2021-11-02: qty 14, 14d supply, fill #0

## 2021-11-02 NOTE — Assessment & Plan Note (Signed)
Unable to tolerate amlodipine due to leg swelling, switching to valsartan. ?2-week blood pressure check at home, and if still elevated I will bump him from 80-160. ?

## 2021-11-02 NOTE — Telephone Encounter (Signed)
I spent 5 total minutes of online digital evaluation and management services in this patient-initiated request for online care. 

## 2021-11-03 DIAGNOSIS — R7989 Other specified abnormal findings of blood chemistry: Secondary | ICD-10-CM | POA: Diagnosis not present

## 2021-11-03 DIAGNOSIS — E790 Hyperuricemia without signs of inflammatory arthritis and tophaceous disease: Secondary | ICD-10-CM | POA: Diagnosis not present

## 2021-11-03 DIAGNOSIS — M2550 Pain in unspecified joint: Secondary | ICD-10-CM | POA: Diagnosis not present

## 2021-11-03 DIAGNOSIS — M0579 Rheumatoid arthritis with rheumatoid factor of multiple sites without organ or systems involvement: Secondary | ICD-10-CM | POA: Diagnosis not present

## 2021-11-05 DIAGNOSIS — I861 Scrotal varices: Secondary | ICD-10-CM | POA: Diagnosis not present

## 2021-11-05 DIAGNOSIS — R002 Palpitations: Secondary | ICD-10-CM | POA: Diagnosis not present

## 2021-11-05 DIAGNOSIS — R0602 Shortness of breath: Secondary | ICD-10-CM | POA: Diagnosis not present

## 2021-11-08 DIAGNOSIS — R002 Palpitations: Secondary | ICD-10-CM | POA: Diagnosis not present

## 2021-11-09 ENCOUNTER — Other Ambulatory Visit: Payer: Self-pay | Admitting: Sports Medicine

## 2021-11-09 ENCOUNTER — Other Ambulatory Visit (HOSPITAL_COMMUNITY): Payer: Self-pay

## 2021-11-09 MED ORDER — VALACYCLOVIR HCL 1 G PO TABS
1000.0000 mg | ORAL_TABLET | Freq: Every day | ORAL | 3 refills | Status: DC | PRN
  Filled 2021-11-09: qty 90, 90d supply, fill #0
  Filled 2022-01-31: qty 90, 90d supply, fill #1
  Filled 2022-05-02: qty 90, 90d supply, fill #2
  Filled 2022-08-17: qty 90, 90d supply, fill #3

## 2021-11-17 ENCOUNTER — Other Ambulatory Visit (HOSPITAL_COMMUNITY): Payer: Self-pay

## 2021-11-17 MED ORDER — VALSARTAN 80 MG PO TABS
80.0000 mg | ORAL_TABLET | Freq: Every day | ORAL | 1 refills | Status: DC
  Filled 2021-11-17: qty 90, 90d supply, fill #0
  Filled 2022-02-16: qty 90, 90d supply, fill #1

## 2021-11-17 NOTE — Addendum Note (Signed)
Addended by: Fonnie Mu on: 11/17/2021 09:08 AM   Modules accepted: Orders

## 2021-11-20 DIAGNOSIS — G4733 Obstructive sleep apnea (adult) (pediatric): Secondary | ICD-10-CM | POA: Diagnosis not present

## 2021-12-20 DIAGNOSIS — G4733 Obstructive sleep apnea (adult) (pediatric): Secondary | ICD-10-CM | POA: Diagnosis not present

## 2022-01-20 DIAGNOSIS — G4733 Obstructive sleep apnea (adult) (pediatric): Secondary | ICD-10-CM | POA: Diagnosis not present

## 2022-01-26 ENCOUNTER — Other Ambulatory Visit: Payer: Self-pay | Admitting: Sports Medicine

## 2022-01-28 ENCOUNTER — Other Ambulatory Visit (HOSPITAL_COMMUNITY): Payer: Self-pay

## 2022-01-28 MED ORDER — ATORVASTATIN CALCIUM 10 MG PO TABS
10.0000 mg | ORAL_TABLET | Freq: Every day | ORAL | 1 refills | Status: DC
  Filled 2022-01-28: qty 90, 90d supply, fill #0
  Filled 2022-05-02: qty 90, 90d supply, fill #1

## 2022-01-30 ENCOUNTER — Other Ambulatory Visit (HOSPITAL_COMMUNITY): Payer: Self-pay

## 2022-01-31 ENCOUNTER — Other Ambulatory Visit (HOSPITAL_COMMUNITY): Payer: Self-pay

## 2022-01-31 DIAGNOSIS — L570 Actinic keratosis: Secondary | ICD-10-CM | POA: Diagnosis not present

## 2022-01-31 DIAGNOSIS — L821 Other seborrheic keratosis: Secondary | ICD-10-CM | POA: Diagnosis not present

## 2022-01-31 DIAGNOSIS — L2089 Other atopic dermatitis: Secondary | ICD-10-CM | POA: Diagnosis not present

## 2022-01-31 DIAGNOSIS — L820 Inflamed seborrheic keratosis: Secondary | ICD-10-CM | POA: Diagnosis not present

## 2022-01-31 DIAGNOSIS — C44319 Basal cell carcinoma of skin of other parts of face: Secondary | ICD-10-CM | POA: Diagnosis not present

## 2022-01-31 DIAGNOSIS — D485 Neoplasm of uncertain behavior of skin: Secondary | ICD-10-CM | POA: Diagnosis not present

## 2022-01-31 MED ORDER — NAPROXEN 500 MG PO TABS
500.0000 mg | ORAL_TABLET | Freq: Every day | ORAL | 1 refills | Status: DC
  Filled 2022-01-31: qty 90, 90d supply, fill #0
  Filled 2022-04-13: qty 90, 90d supply, fill #1

## 2022-01-31 MED ORDER — CLOBETASOL PROPIONATE 0.05 % EX CREA
TOPICAL_CREAM | Freq: Two times a day (BID) | CUTANEOUS | 0 refills | Status: DC
Start: 2022-01-31 — End: 2024-04-19
  Filled 2022-01-31: qty 15, 7d supply, fill #0

## 2022-02-16 ENCOUNTER — Other Ambulatory Visit (HOSPITAL_COMMUNITY): Payer: Self-pay

## 2022-03-02 ENCOUNTER — Ambulatory Visit: Payer: BC Managed Care – PPO

## 2022-03-02 DIAGNOSIS — C44319 Basal cell carcinoma of skin of other parts of face: Secondary | ICD-10-CM | POA: Diagnosis not present

## 2022-03-09 DIAGNOSIS — L905 Scar conditions and fibrosis of skin: Secondary | ICD-10-CM | POA: Diagnosis not present

## 2022-04-14 ENCOUNTER — Other Ambulatory Visit (HOSPITAL_COMMUNITY): Payer: Self-pay

## 2022-04-15 ENCOUNTER — Other Ambulatory Visit (HOSPITAL_COMMUNITY): Payer: Self-pay

## 2022-04-18 ENCOUNTER — Other Ambulatory Visit (HOSPITAL_COMMUNITY): Payer: Self-pay

## 2022-05-02 ENCOUNTER — Other Ambulatory Visit (HOSPITAL_COMMUNITY): Payer: Self-pay

## 2022-05-05 ENCOUNTER — Other Ambulatory Visit (HOSPITAL_COMMUNITY): Payer: Self-pay

## 2022-05-19 ENCOUNTER — Other Ambulatory Visit: Payer: Self-pay | Admitting: Sports Medicine

## 2022-05-19 DIAGNOSIS — I10 Essential (primary) hypertension: Secondary | ICD-10-CM

## 2022-05-20 ENCOUNTER — Other Ambulatory Visit (HOSPITAL_COMMUNITY): Payer: Self-pay

## 2022-05-20 MED ORDER — VALSARTAN 80 MG PO TABS
80.0000 mg | ORAL_TABLET | Freq: Every day | ORAL | 1 refills | Status: DC
  Filled 2022-05-20: qty 90, 90d supply, fill #0
  Filled 2022-09-19: qty 90, 90d supply, fill #1

## 2022-07-05 ENCOUNTER — Ambulatory Visit: Payer: Self-pay | Admitting: Licensed Clinical Social Worker

## 2022-07-05 NOTE — Patient Outreach (Signed)
  Care Coordination   07/05/2022 Name: Andre Cohen MRN: [id number] DOB: 1970-12-26   Care Coordination Outreach Attempts:  An unsuccessful telephone outreach was attempted today to offer the patient information about available care coordination services as a benefit of their health plan.   Follow Up Plan:  Additional outreach attempts will be made to offer the patient care coordination information and services.   Encounter Outcome:  No Answer   Care Coordination Interventions:  No, not indicated    Norva Riffle.Robbert Langlinais MSW, [place] representative [facility] Care Management [phone]

## 2022-07-14 ENCOUNTER — Other Ambulatory Visit (HOSPITAL_COMMUNITY): Payer: Self-pay

## 2022-07-15 ENCOUNTER — Other Ambulatory Visit (HOSPITAL_COMMUNITY): Payer: Self-pay

## 2022-07-18 ENCOUNTER — Other Ambulatory Visit (HOSPITAL_COMMUNITY): Payer: Self-pay

## 2022-07-18 MED ORDER — NAPROXEN 500 MG PO TABS
500.0000 mg | ORAL_TABLET | Freq: Every day | ORAL | 1 refills | Status: DC
  Filled 2022-07-18: qty 90, 90d supply, fill #0
  Filled 2023-01-04: qty 90, 90d supply, fill #1

## 2022-08-03 ENCOUNTER — Other Ambulatory Visit: Payer: Self-pay | Admitting: Sports Medicine

## 2022-08-05 ENCOUNTER — Other Ambulatory Visit (HOSPITAL_COMMUNITY): Payer: Self-pay

## 2022-08-18 ENCOUNTER — Other Ambulatory Visit: Payer: Self-pay

## 2022-09-19 ENCOUNTER — Other Ambulatory Visit (HOSPITAL_COMMUNITY): Payer: Self-pay

## 2022-11-10 ENCOUNTER — Other Ambulatory Visit: Payer: Self-pay | Admitting: Sports Medicine

## 2022-11-11 ENCOUNTER — Other Ambulatory Visit (HOSPITAL_COMMUNITY): Payer: Self-pay

## 2022-11-18 ENCOUNTER — Other Ambulatory Visit: Payer: Self-pay | Admitting: Sports Medicine

## 2022-11-18 ENCOUNTER — Other Ambulatory Visit (HOSPITAL_COMMUNITY): Payer: Self-pay

## 2022-11-28 ENCOUNTER — Encounter: Payer: Self-pay | Admitting: Sports Medicine

## 2022-11-29 ENCOUNTER — Other Ambulatory Visit (HOSPITAL_COMMUNITY): Payer: Self-pay

## 2022-11-29 MED ORDER — VALACYCLOVIR HCL 1 G PO TABS
1000.0000 mg | ORAL_TABLET | Freq: Every day | ORAL | 3 refills | Status: DC | PRN
  Filled 2022-11-29: qty 90, 90d supply, fill #0
  Filled 2023-03-12: qty 90, 90d supply, fill #1
  Filled 2023-05-30: qty 90, 90d supply, fill #2
  Filled 2023-08-27: qty 90, 90d supply, fill #3

## 2022-12-01 ENCOUNTER — Other Ambulatory Visit (HOSPITAL_COMMUNITY): Payer: Self-pay

## 2023-01-05 ENCOUNTER — Other Ambulatory Visit (HOSPITAL_COMMUNITY): Payer: Self-pay

## 2023-03-22 ENCOUNTER — Encounter: Payer: Self-pay | Admitting: Sports Medicine

## 2023-03-22 ENCOUNTER — Ambulatory Visit (INDEPENDENT_AMBULATORY_CARE_PROVIDER_SITE_OTHER): Payer: No Typology Code available for payment source | Admitting: Sports Medicine

## 2023-03-22 VITALS — BP 126/83 | HR 83 | Ht 73.0 in | Wt 223.0 lb

## 2023-03-22 DIAGNOSIS — Z23 Encounter for immunization: Secondary | ICD-10-CM | POA: Diagnosis not present

## 2023-03-22 DIAGNOSIS — R7303 Prediabetes: Secondary | ICD-10-CM | POA: Diagnosis not present

## 2023-03-22 DIAGNOSIS — G4730 Sleep apnea, unspecified: Secondary | ICD-10-CM

## 2023-03-22 DIAGNOSIS — R972 Elevated prostate specific antigen [PSA]: Secondary | ICD-10-CM | POA: Diagnosis not present

## 2023-03-22 DIAGNOSIS — I10 Essential (primary) hypertension: Secondary | ICD-10-CM | POA: Diagnosis not present

## 2023-03-22 DIAGNOSIS — Z0000 Encounter for general adult medical examination without abnormal findings: Secondary | ICD-10-CM

## 2023-03-22 DIAGNOSIS — E7849 Other hyperlipidemia: Secondary | ICD-10-CM

## 2023-03-22 NOTE — Assessment & Plan Note (Signed)
Annual physical as above, flu and Shingrix today, return nurse visit 2 to 6 months for Shingrix #2.

## 2023-03-22 NOTE — Assessment & Plan Note (Signed)
Andre Cohen is using his CPAP, he still does endorse some difficulty sleeping, he will go to bed at maybe 10 or 11 and wake up at 4 AM, he typically needs about 5 hours of sleep to feel well rested. On further questioning he does tend to get up to void at times, we did discuss sleep hygiene measures including avoiding consumption of liquids within 3 hours of bedtime, he does have exposure to bright screens through the night and through the evening, we discussed minimizing bright screen use as well as using bluelight filters. He will let me know how things are going, I have advised against use of supplements or Benadryl, and we would certainly consider trazodone if the above is not successful.

## 2023-03-22 NOTE — Progress Notes (Signed)
Subjective:    CC: Annual Physical Exam  HPI:  This patient is here for their annual physical  I reviewed the past medical history, family history, social history, surgical history, and allergies today and no changes were needed.  Please see the problem list section below in epic for further details.  Past Medical History: Past Medical History:  Diagnosis Date   Asthma    Carpal tunnel syndrome on both sides    Hyperlipidemia    LFT elevation    VITAMIN USE IN THE PAST   RA (rheumatoid arthritis) (HCC)    Sleep apnea    wear CPAP nightly   Past Surgical History: Past Surgical History:  Procedure Laterality Date   CARPAL TUNNEL RELEASE Right 04/15/2016   Procedure: CARPAL TUNNEL RELEASE, right;  Surgeon: Cindee Salt, MD;  Location: [facility];  Service: Orthopedics;  Laterality: Right;  FAB   WISDOM TOOTH EXTRACTION     Social History: Social History   Socioeconomic History   Marital status: Married    Spouse name: Not on file   Number of children: Not on file   Years of education: Not on file   Highest education level: Not on file  Occupational History   Not on file  Tobacco Use   Smoking status: Never   Smokeless tobacco: Never  Vaping Use   Vaping status: Never Used  Substance and Sexual Activity   Alcohol use: Yes    Comment: social   Drug use: No   Sexual activity: Not on file  Other Topics Concern   Not on file  Social History Narrative   Not on file   Social Determinants of Health   Financial Resource Strain: Not on file  Food Insecurity: Not on file  Transportation Needs: Not on file  Physical Activity: Not on file  Stress: Not on file  Social Connections: Not on file   Family History: Family History  Problem Relation Age of Onset   Hypertension Mother    Hypertension Father    Hypertension Sister    Colon polyps Sister    Hypertension Brother    Cancer Brother        skin   Colon cancer Neg Hx    Esophageal cancer Neg  Hx    Rectal cancer Neg Hx    Stomach cancer Neg Hx    Allergies: Allergies  Allergen Reactions   Doxycycline Rash   Medications: See med rec.  Review of Systems: No headache, visual changes, nausea, vomiting, diarrhea, constipation, dizziness, abdominal pain, skin rash, fevers, chills, night sweats, swollen lymph nodes, weight loss, chest pain, body aches, joint swelling, muscle aches, shortness of breath, mood changes, visual or auditory hallucinations.  Objective:    General: Well Developed, well nourished, and in no acute distress.  Neuro: Alert and oriented x3, extra-ocular muscles intact, sensation grossly intact. Cranial nerves II through XII are intact, motor, sensory, and coordinative functions are all intact. HEENT: Normocephalic, atraumatic, pupils equal round reactive to light, neck supple, no masses, no lymphadenopathy, thyroid nonpalpable. Oropharynx, nasopharynx, external ear canals are unremarkable. Skin: Warm and dry, no rashes noted.  Cardiac: Regular rate and rhythm, no murmurs rubs or gallops.  Respiratory: Clear to auscultation bilaterally. Not using accessory muscles, speaking in full sentences.  Abdominal: Soft, nontender, nondistended, positive bowel sounds, no masses, no organomegaly.  Musculoskeletal: Shoulder, elbow, wrist, hip, knee, ankle stable, and with full range of motion.  Impression and Recommendations:    The patient was  counselled, risk factors were discussed, anticipatory guidance given.  Annual physical exam Annual physical as above, flu and Shingrix today, return nurse visit 2 to 6 months for Shingrix #2.  Benign essential hypertension Diet controlled, off of all medication.  Sleep apnea Jesusita Oka is using his CPAP, he still does endorse some difficulty sleeping, he will go to bed at maybe 10 or 11 and wake up at 4 AM, he typically needs about 5 hours of sleep to feel well rested. On further questioning he does tend to get up to void at times, we  did discuss sleep hygiene measures including avoiding consumption of liquids within 3 hours of bedtime, he does have exposure to bright screens through the night and through the evening, we discussed minimizing bright screen use as well as using bluelight filters. He will let me know how things are going, I have advised against use of supplements or Benadryl, and we would certainly consider trazodone if the above is not successful.  Hyperlipidemia Discontinued Lipitor, needs to restart.   ____________________________________________ Ihor Austin. Benjamin Stain, M.D., ABFM., CAQSM., AME. Primary Care and Sports Medicine [facility] [facility]  Adjunct Professor of Family Medicine  [employer] [facility] of Medicine  Restaurant manager, fast food

## 2023-03-22 NOTE — Assessment & Plan Note (Signed)
Diet controlled, off of all medication.

## 2023-03-23 ENCOUNTER — Other Ambulatory Visit (HOSPITAL_COMMUNITY): Payer: Self-pay

## 2023-03-23 LAB — PSA, TOTAL AND FREE
PSA, Free Pct: 41.5 %
PSA, Free: 0.54 ng/mL
Prostate Specific Ag, Serum: 1.3 ng/mL (ref 0.0–4.0)

## 2023-03-23 LAB — COMPREHENSIVE METABOLIC PANEL
ALT: 33 [IU]/L (ref 0–44)
AST: 28 [IU]/L (ref 0–40)
Albumin: 4.7 g/dL (ref 3.8–4.9)
Alkaline Phosphatase: 61 [IU]/L (ref 44–121)
BUN/Creatinine Ratio: 20 (ref 9–20)
BUN: 17 mg/dL (ref 6–24)
Bilirubin Total: 0.2 mg/dL (ref 0.0–1.2)
CO2: 20 mmol/L (ref 20–29)
Calcium: 10 mg/dL (ref 8.7–10.2)
Chloride: 103 mmol/L (ref 96–106)
Creatinine, Ser: 0.86 mg/dL (ref 0.76–1.27)
Globulin, Total: 2.9 g/dL (ref 1.5–4.5)
Glucose: 108 mg/dL — ABNORMAL HIGH (ref 70–99)
Potassium: 4.4 mmol/L (ref 3.5–5.2)
Sodium: 141 mmol/L (ref 134–144)
Total Protein: 7.6 g/dL (ref 6.0–8.5)
eGFR: 103 mL/min/{1.73_m2} (ref >59)

## 2023-03-23 LAB — LIPID PANEL
Chol/HDL Ratio: 4.8 {ratio} (ref 0.0–5.0)
Cholesterol, Total: 217 mg/dL — ABNORMAL HIGH (ref 100–199)
HDL: 45 mg/dL (ref >39)
LDL Chol Calc (NIH): 157 mg/dL — ABNORMAL HIGH (ref 0–99)
Triglycerides: 84 mg/dL (ref 0–149)
VLDL Cholesterol Cal: 15 mg/dL (ref 5–40)

## 2023-03-23 LAB — CBC
Hematocrit: 47.9 % (ref 37.5–51.0)
Hemoglobin: 15.6 g/dL (ref 13.0–17.7)
MCH: 31.1 pg (ref 26.6–33.0)
MCHC: 32.6 g/dL (ref 31.5–35.7)
MCV: 96 fL (ref 79–97)
Platelets: 259 10*3/uL (ref 150–450)
RBC: 5.01 x10E6/uL (ref 4.14–5.80)
RDW: 12.3 % (ref 11.6–15.4)
WBC: 5.7 10*3/uL (ref 3.4–10.8)

## 2023-03-23 LAB — TSH: TSH: 1.62 u[IU]/mL (ref 0.450–4.500)

## 2023-03-23 LAB — HEMOGLOBIN A1C
Est. average glucose Bld gHb Est-mCnc: 120 mg/dL
Hgb A1c MFr Bld: 5.8 % — ABNORMAL HIGH (ref 4.8–5.6)

## 2023-03-23 MED ORDER — ATORVASTATIN CALCIUM 10 MG PO TABS
10.0000 mg | ORAL_TABLET | Freq: Every day | ORAL | 3 refills | Status: DC
  Filled 2023-03-23: qty 90, 90d supply, fill #0
  Filled 2023-06-22: qty 90, 90d supply, fill #1
  Filled 2023-09-25: qty 90, 90d supply, fill #2
  Filled 2023-12-21: qty 90, 90d supply, fill #3

## 2023-03-23 NOTE — Assessment & Plan Note (Signed)
Discontinued Lipitor, needs to restart.

## 2023-03-23 NOTE — Addendum Note (Signed)
Addended by: Monica Becton on: 03/23/2023 04:24 PM   Modules accepted: Orders

## 2023-04-13 ENCOUNTER — Other Ambulatory Visit (HOSPITAL_COMMUNITY): Payer: Self-pay

## 2023-04-14 ENCOUNTER — Other Ambulatory Visit (HOSPITAL_COMMUNITY): Payer: Self-pay

## 2023-04-14 MED ORDER — NAPROXEN 500 MG PO TABS
500.0000 mg | ORAL_TABLET | Freq: Every day | ORAL | 1 refills | Status: DC | PRN
  Filled 2023-04-14: qty 90, 90d supply, fill #0
  Filled 2023-10-03: qty 90, 90d supply, fill #1

## 2023-06-26 ENCOUNTER — Ambulatory Visit (INDEPENDENT_AMBULATORY_CARE_PROVIDER_SITE_OTHER): Payer: No Typology Code available for payment source

## 2023-06-26 DIAGNOSIS — Z23 Encounter for immunization: Secondary | ICD-10-CM | POA: Diagnosis not present

## 2023-06-26 NOTE — Progress Notes (Signed)
   Established Patient Office Visit  Subjective   Patient ID: Andre Cohen, male    DOB: 1970/09/12  Age: 53 y.o. MRN: [id number]  Chief Complaint  Patient presents with   Immunizations    HPI  Andre Cohen is here for last shingles vaccine.   ROS    Objective:     There were no vitals taken for this visit.   Physical Exam   No results found for any visits on 06/26/23.    The 10-year ASCVD risk score (Arnett DK, et al., 2019) is: 6%    Assessment & Plan:  Shingles vaccine - Patient tolerated injection well without complications.    Problem List Items Addressed This Visit   None Visit Diagnoses       Encounter for immunization    -  Primary   Relevant Orders   Varicella-zoster vaccine IM (Completed)       No follow-ups on file.    [name], CMA

## 2023-07-07 ENCOUNTER — Encounter: Payer: Self-pay | Admitting: Sports Medicine

## 2023-07-07 ENCOUNTER — Ambulatory Visit: Payer: No Typology Code available for payment source | Admitting: Sports Medicine

## 2023-07-07 ENCOUNTER — Other Ambulatory Visit (HOSPITAL_COMMUNITY): Payer: Self-pay

## 2023-07-07 VITALS — BP 132/87 | HR 70 | Ht 72.0 in | Wt 227.0 lb

## 2023-07-07 DIAGNOSIS — E6609 Other obesity due to excess calories: Secondary | ICD-10-CM | POA: Diagnosis not present

## 2023-07-07 DIAGNOSIS — M0579 Rheumatoid arthritis with rheumatoid factor of multiple sites without organ or systems involvement: Secondary | ICD-10-CM | POA: Diagnosis not present

## 2023-07-07 DIAGNOSIS — E66811 Obesity, class 1: Secondary | ICD-10-CM | POA: Diagnosis not present

## 2023-07-07 DIAGNOSIS — E669 Obesity, unspecified: Secondary | ICD-10-CM | POA: Insufficient documentation

## 2023-07-07 DIAGNOSIS — G4730 Sleep apnea, unspecified: Secondary | ICD-10-CM

## 2023-07-07 DIAGNOSIS — Z6831 Body mass index (BMI) 31.0-31.9, adult: Secondary | ICD-10-CM

## 2023-07-07 MED ORDER — ZEPBOUND 2.5 MG/0.5ML ~~LOC~~ SOAJ
2.5000 mg | SUBCUTANEOUS | 0 refills | Status: DC
Start: 2023-07-07 — End: 2023-11-15
  Filled 2023-07-07 – 2023-08-10 (×3): qty 2, 28d supply, fill #0

## 2023-07-07 MED ORDER — PREDNISONE 10 MG (48) PO TBPK
ORAL_TABLET | Freq: Every day | ORAL | 0 refills | Status: DC
  Filled 2023-07-07: qty 48, 12d supply, fill #0

## 2023-07-07 NOTE — Assessment & Plan Note (Addendum)
Andre Cohen does have obesity, his BMI is over 30, he has comorbidities of elevated blood pressure, hyperlipidemia, obstructive sleep apnea, osteoarthritis, steatohepatitis. Ideal weight 171 to 193 pounds. For this reason we will try to get him approved for Zepbound. If unable to get Zepbound approved we will try compounded tirzepatide. For insurance purposes he will be in a multidisciplinary weight loss program with calorie counting and an exercise prescription.

## 2023-07-07 NOTE — Assessment & Plan Note (Signed)
Off of Humira, starting to have a flare, adding a prednisone 12-day taper, he can follow this up with his rheumatologist.

## 2023-07-07 NOTE — Progress Notes (Signed)
    Procedures performed today:    None.  Independent interpretation of notes and tests performed by another provider:   None.  Brief History, Exam, Impression, and Recommendations:    Obesity Jesusita Oka does have obesity, his BMI is over 30, he has comorbidities of elevated blood pressure, hyperlipidemia, obstructive sleep apnea, osteoarthritis, steatohepatitis. Ideal weight 171 to 193 pounds. For this reason we will try to get him approved for Zepbound. If unable to get Zepbound approved we will try compounded tirzepatide. For insurance purposes he will be in a multidisciplinary weight loss program with calorie counting and an exercise prescription.  Sleep apnea Known OSA, wears CPAP, we will try to get him to lose a good amount of weight and hopefully improve or cure his sleep apnea. After 20 to 30 pounds weight loss we will consider repeat sleep study.  Rheumatoid arthritis, seropositive, multiple sites (HCC) Off of Humira, starting to have a flare, adding a prednisone 12-day taper, he can follow this up with his rheumatologist.    ____________________________________________ Ihor Austin. Benjamin Stain, M.D., ABFM., CAQSM., AME. Primary Care and Sports Medicine [place] [facility]  Adjunct Professor of Family Medicine  [place] [employer] [facility] [employer] of Medicine  Restaurant manager, fast food

## 2023-07-07 NOTE — Assessment & Plan Note (Signed)
Known OSA, wears CPAP, we will try to get him to lose a good amount of weight and hopefully improve or cure his sleep apnea. After 20 to 30 pounds weight loss we will consider repeat sleep study.

## 2023-07-08 LAB — LIPID PANEL
Cholesterol: 149 (ref 0–200)
HDL: 39 (ref 35–70)
LDL Cholesterol: 99
LDl/HDL Ratio: 3.8
Triglycerides: 51 (ref 40–160)

## 2023-07-08 LAB — COMPREHENSIVE METABOLIC PANEL
Albumin: 4.9 (ref 3.5–5.0)
Calcium: 9.9 (ref 8.7–10.7)
Calcium: 9.9 (ref 8.7–10.7)
Globulin: 2.8

## 2023-07-08 LAB — HEPATIC FUNCTION PANEL
ALT: 35 U/L (ref 10–40)
AST: 26 (ref 14–40)
Alkaline Phosphatase: 81 (ref 25–125)
Bilirubin, Total: 0.5

## 2023-07-08 LAB — BASIC METABOLIC PANEL
BUN: 12 (ref 4–21)
Chloride: 98 — AB (ref 99–108)
Creatinine: 0.8 (ref 0.6–1.3)
Glucose: 82
Potassium: 5 meq/L (ref 3.5–5.1)
Sodium: 140 (ref 137–147)

## 2023-07-08 LAB — TSH: TSH: 1.14 (ref 0.41–5.90)

## 2023-07-08 LAB — CBC: RBC: 4.85 (ref 3.87–5.11)

## 2023-07-08 LAB — CBC AND DIFFERENTIAL
HCT: 46 (ref 41–53)
Hemoglobin: 15.3 (ref 13.5–17.5)
Platelets: 308 10*3/uL (ref 150–400)
WBC: 9.7

## 2023-07-12 ENCOUNTER — Encounter: Payer: Self-pay | Admitting: Sports Medicine

## 2023-07-13 ENCOUNTER — Encounter: Payer: Self-pay | Admitting: Sports Medicine

## 2023-07-13 NOTE — Telephone Encounter (Signed)
Thank you for getting them abstracted!

## 2023-08-08 ENCOUNTER — Other Ambulatory Visit (HOSPITAL_COMMUNITY): Payer: Self-pay

## 2023-08-08 ENCOUNTER — Encounter (HOSPITAL_COMMUNITY): Payer: Self-pay

## 2023-08-10 ENCOUNTER — Other Ambulatory Visit (HOSPITAL_COMMUNITY): Payer: Self-pay

## 2023-08-11 ENCOUNTER — Other Ambulatory Visit (HOSPITAL_COMMUNITY): Payer: Self-pay

## 2023-08-11 MED ORDER — ZEPBOUND 2.5 MG/0.5ML ~~LOC~~ SOAJ
2.5000 mg | SUBCUTANEOUS | 0 refills | Status: DC
Start: 2023-08-10 — End: 2023-11-15
  Filled 2023-08-11: qty 2, 28d supply, fill #0

## 2023-09-01 ENCOUNTER — Other Ambulatory Visit (HOSPITAL_COMMUNITY): Payer: Self-pay

## 2023-09-01 MED ORDER — ZEPBOUND 5 MG/0.5ML ~~LOC~~ SOAJ
5.0000 mg | SUBCUTANEOUS | 0 refills | Status: DC
Start: 2023-09-01 — End: 2023-11-15
  Filled 2023-09-01: qty 2, 28d supply, fill #0

## 2023-10-04 ENCOUNTER — Ambulatory Visit (INDEPENDENT_AMBULATORY_CARE_PROVIDER_SITE_OTHER)

## 2023-10-04 ENCOUNTER — Other Ambulatory Visit (HOSPITAL_COMMUNITY): Payer: Self-pay

## 2023-10-04 ENCOUNTER — Encounter: Payer: Self-pay | Admitting: Sports Medicine

## 2023-10-04 ENCOUNTER — Ambulatory Visit: Admitting: Sports Medicine

## 2023-10-04 DIAGNOSIS — F5101 Primary insomnia: Secondary | ICD-10-CM | POA: Diagnosis not present

## 2023-10-04 DIAGNOSIS — M898X6 Other specified disorders of bone, lower leg: Secondary | ICD-10-CM | POA: Diagnosis not present

## 2023-10-04 DIAGNOSIS — M1712 Unilateral primary osteoarthritis, left knee: Secondary | ICD-10-CM

## 2023-10-04 DIAGNOSIS — Z0189 Encounter for other specified special examinations: Secondary | ICD-10-CM | POA: Diagnosis not present

## 2023-10-04 DIAGNOSIS — G4700 Insomnia, unspecified: Secondary | ICD-10-CM | POA: Insufficient documentation

## 2023-10-04 MED ORDER — TRAZODONE HCL 50 MG PO TABS
50.0000 mg | ORAL_TABLET | Freq: Every day | ORAL | 1 refills | Status: DC
  Filled 2023-10-04: qty 30, 30d supply, fill #0
  Filled 2024-02-01: qty 30, 30d supply, fill #1

## 2023-10-04 NOTE — Assessment & Plan Note (Signed)
 Increasing work stress, having some insomnia in the form of waking up too early, he goes to bed at about 11, wakes up at 5, does not really feel well rested. Sleep hygiene could use some work, there is a TV in the bedroom, wife on phone at night  sees, watches TV through the evening. We discussed setting up the bedroom to create a successful environment for sleep. He will try this for approximately 4 weeks before considering trazodone.

## 2023-10-04 NOTE — Assessment & Plan Note (Signed)
 Increasing pain left knee anterior aspect particularly with flexion, history of rheumatoid arthritis off of meds. Exam is normal with the exception of patellar crepitus and a positive painful patellar compression test, otherwise normal. Adding XR and home conditioning. RTC 6 weeks.

## 2023-10-04 NOTE — Progress Notes (Addendum)
    Procedures performed today:    None.  Independent interpretation of notes and tests performed by another provider:   None.  Brief History, Exam, Impression, and Recommendations:    Insomnia Increasing work stress, having some insomnia in the form of waking up too early, he goes to bed at about 11, wakes up at 5, does not really feel well rested. Sleep hygiene could use some work, there is a TV in the bedroom, wife on phone at night  sees, watches TV through the evening. We discussed setting up the bedroom to create a successful environment for sleep. He will try this for approximately 4 weeks before considering trazodone.   Patellofemoral arthritis of left knee Increasing pain left knee anterior aspect particularly with flexion, history of rheumatoid arthritis off of meds. Exam is normal with the exception of patellar crepitus and a positive painful patellar compression test, otherwise normal. Adding XR and home conditioning. RTC 6 weeks.  Tibial mass, left Noted bubbly cystic expansile lesion left proximal tibia, right distal femur. 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     ____________________________________________ Joselyn Nicely. Sandy Crumb, M.D., ABFM., CAQSM., AME. Primary Care and Sports Medicine [place] [facility]  Adjunct Professor of [facility] Medicine  [employer]  Restaurant manager, fast food

## 2023-10-05 ENCOUNTER — Encounter: Payer: Self-pay | Admitting: Sports Medicine

## 2023-10-05 DIAGNOSIS — M898X6 Other specified disorders of bone, lower leg: Secondary | ICD-10-CM | POA: Insufficient documentation

## 2023-10-05 DIAGNOSIS — M0579 Rheumatoid arthritis with rheumatoid factor of multiple sites without organ or systems involvement: Secondary | ICD-10-CM

## 2023-10-05 DIAGNOSIS — S83206A Unspecified tear of unspecified meniscus, current injury, right knee, initial encounter: Secondary | ICD-10-CM

## 2023-10-05 NOTE — Addendum Note (Signed)
 Addended by: Gean Keels on: 10/05/2023 12:36 PM   Modules accepted: Orders

## 2023-10-05 NOTE — Assessment & Plan Note (Signed)
 Noted bubbly cystic expansile lesion left proximal tibia, right distal femur. 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

## 2023-10-13 ENCOUNTER — Other Ambulatory Visit (HOSPITAL_COMMUNITY): Payer: Self-pay

## 2023-10-13 MED ORDER — ZEPBOUND 7.5 MG/0.5ML ~~LOC~~ SOAJ
7.5000 mg | SUBCUTANEOUS | 0 refills | Status: DC
  Filled 2023-10-13: qty 2, 28d supply, fill #0

## 2023-10-17 ENCOUNTER — Ambulatory Visit

## 2023-10-17 DIAGNOSIS — M0579 Rheumatoid arthritis with rheumatoid factor of multiple sites without organ or systems involvement: Secondary | ICD-10-CM

## 2023-10-17 DIAGNOSIS — M25561 Pain in right knee: Secondary | ICD-10-CM | POA: Diagnosis not present

## 2023-10-17 DIAGNOSIS — S83206A Unspecified tear of unspecified meniscus, current injury, right knee, initial encounter: Secondary | ICD-10-CM

## 2023-10-17 DIAGNOSIS — M898X6 Other specified disorders of bone, lower leg: Secondary | ICD-10-CM

## 2023-10-17 MED ORDER — GADOBUTROL 1 MMOL/ML IV SOLN
10.0000 mL | Freq: Once | INTRAVENOUS | Status: AC | PRN
Start: 2023-10-17 — End: 2023-10-17
  Administered 2023-10-17: 10 mL via INTRAVENOUS

## 2023-10-23 ENCOUNTER — Telehealth: Payer: Self-pay

## 2023-10-23 NOTE — Telephone Encounter (Signed)
 Called radiology reading room and requested both MRI's of bilateral knees be moved  up for reading.  Patient also informed that we were expediting these orders.

## 2023-10-23 NOTE — Telephone Encounter (Signed)
 Copied from CRM [id number]. Topic: Clinical - Lab/Test Results >> Oct 23, 2023 10:13 AM Wynona Hedger wrote: Reason for CRM: Patient wants to know results from ct scan on 4/29. Please call [phone]

## 2023-10-23 NOTE — Telephone Encounter (Signed)
 Yes, please expedite

## 2023-10-23 NOTE — Telephone Encounter (Signed)
 Did not see a CT scan to result  But patient MRI images done on 10/17/23 have not resulted.  Did you want me to expedite these results?

## 2023-10-26 ENCOUNTER — Encounter: Payer: Self-pay | Admitting: Sports Medicine

## 2023-11-08 ENCOUNTER — Other Ambulatory Visit (HOSPITAL_COMMUNITY): Payer: Self-pay

## 2023-11-08 MED ORDER — ZEPBOUND 10 MG/0.5ML ~~LOC~~ SOAJ
10.0000 mg | SUBCUTANEOUS | 0 refills | Status: DC
  Filled 2023-11-08: qty 2, 28d supply, fill #0

## 2023-11-09 ENCOUNTER — Other Ambulatory Visit (HOSPITAL_COMMUNITY): Payer: Self-pay

## 2023-11-09 MED ORDER — ZEPBOUND 10 MG/0.5ML ~~LOC~~ SOAJ
10.0000 mg | SUBCUTANEOUS | 0 refills | Status: DC
  Filled 2023-11-09 – 2023-11-16 (×2): qty 2, 28d supply, fill #0

## 2023-11-15 ENCOUNTER — Other Ambulatory Visit (HOSPITAL_COMMUNITY): Payer: Self-pay

## 2023-11-15 ENCOUNTER — Ambulatory Visit: Admitting: Sports Medicine

## 2023-11-15 ENCOUNTER — Encounter: Payer: Self-pay | Admitting: Sports Medicine

## 2023-11-15 DIAGNOSIS — E6609 Other obesity due to excess calories: Secondary | ICD-10-CM | POA: Diagnosis not present

## 2023-11-15 DIAGNOSIS — R41840 Attention and concentration deficit: Secondary | ICD-10-CM

## 2023-11-15 DIAGNOSIS — E66811 Obesity, class 1: Secondary | ICD-10-CM | POA: Diagnosis not present

## 2023-11-15 DIAGNOSIS — R200 Anesthesia of skin: Secondary | ICD-10-CM

## 2023-11-15 DIAGNOSIS — R202 Paresthesia of skin: Secondary | ICD-10-CM

## 2023-11-15 DIAGNOSIS — Z6831 Body mass index (BMI) 31.0-31.9, adult: Secondary | ICD-10-CM

## 2023-11-15 MED ORDER — ZEPBOUND 10 MG/0.5ML ~~LOC~~ SOAJ
10.0000 mg | SUBCUTANEOUS | 0 refills | Status: DC
  Filled 2023-11-15 (×2): qty 2, 28d supply, fill #0

## 2023-11-15 NOTE — Progress Notes (Signed)
    Procedures performed today:    None.  Independent interpretation of notes and tests performed by another provider:   None.  Brief History, Exam, Impression, and Recommendations:    Obesity Currently on Zepbound  10 mg, he did have a BMI over 30, multiple comorbidities including hypertension, hyperlipidemia, obstructive sleep apnea, osteoarthritis, steatohepatitis. Ideal weight was between 171 and 193 pounds. He was able to get Zepbound , he has done really well. It sounds like we are going to need to take over, and his insurance may require a different brand such as Wegovy. We will try to get Zepbound  again, for insurance purposes he will be enrolled in a multidisciplinary weight loss program with calorie counting, and exercise prescription, he has no contraindications to GLP-1 treatment and he will not be taking any other weight loss medications.  Poor concentration Recently dad has noted some difficulty with distractibility at work, he wonders if he may have ADHD, at this point his symptoms are minor enough where he can find ways to work around the distractibility. I have advised him to do his best to get quality sleep as his symptoms do seem to be a bit better after good nights of sleep. He will consider low doses of caffeine as and a couple coffee. If he asks however I can pull a trigger for formal ADHD testing.  Numbness and tingling of left worse than right thumbs Having some tingling isolated bilateral thumbs left worse than right. Carpal tunnel release on the right in the past. On exam he has a minimally positive Tinel sign bilaterally, negative Phalen sign, he does have a positive Spurling sign on the left, I do think this may be combination of carpal tunnel syndrome and cervical radiculopathy, C6 distribution however I think the dominant finding is C6 radiculopathy. No red flag symptoms. We will treat both conservatively, adding home conditioning for the neck and the carpal  tunnel, return to see me 6 weeks as needed for this.  I spent 40 minutes of total time managing this patient today, this includes chart review, face to face, and non-face to face time.  Dan had a list, we answered all questions.  ____________________________________________ Joselyn Nicely. Sandy Crumb, M.D., ABFM., CAQSM., AME. Primary Care and Sports Medicine [facility]  Adjunct Professor of [facility] Medicine  [employer]  Restaurant manager, fast food

## 2023-11-15 NOTE — Assessment & Plan Note (Signed)
 Currently on Zepbound  10 mg, he did have a BMI over 30, multiple comorbidities including hypertension, hyperlipidemia, obstructive sleep apnea, osteoarthritis, steatohepatitis. Ideal weight was between 171 and 193 pounds. He was able to get Zepbound , he has done really well. It sounds like we are going to need to take over, and his insurance may require a different brand such as Wegovy. We will try to get Zepbound  again, for insurance purposes he will be enrolled in a multidisciplinary weight loss program with calorie counting, and exercise prescription, he has no contraindications to GLP-1 treatment and he will not be taking any other weight loss medications.

## 2023-11-15 NOTE — Assessment & Plan Note (Signed)
 Recently dad has noted some difficulty with distractibility at work, he wonders if he may have ADHD, at this point his symptoms are minor enough where he can find ways to work around the distractibility. I have advised him to do his best to get quality sleep as his symptoms do seem to be a bit better after good nights of sleep. He will consider low doses of caffeine as and a couple coffee. If he asks however I can pull a trigger for formal ADHD testing.

## 2023-11-15 NOTE — Assessment & Plan Note (Signed)
 Having some tingling isolated bilateral thumbs left worse than right. Carpal tunnel release on the right in the past. On exam he has a minimally positive Tinel sign bilaterally, negative Phalen sign, he does have a positive Spurling sign on the left, I do think this may be combination of carpal tunnel syndrome and cervical radiculopathy, C6 distribution however I think the dominant finding is C6 radiculopathy. No red flag symptoms. We will treat both conservatively, adding home conditioning for the neck and the carpal tunnel, return to see me 6 weeks as needed for this.

## 2023-11-16 ENCOUNTER — Other Ambulatory Visit (HOSPITAL_COMMUNITY): Payer: Self-pay

## 2023-11-17 ENCOUNTER — Other Ambulatory Visit (HOSPITAL_COMMUNITY): Payer: Self-pay

## 2023-11-17 ENCOUNTER — Other Ambulatory Visit: Payer: Self-pay | Admitting: Sports Medicine

## 2023-11-17 MED ORDER — VALACYCLOVIR HCL 1 G PO TABS
1000.0000 mg | ORAL_TABLET | Freq: Every day | ORAL | 3 refills | Status: DC | PRN
  Filled 2023-11-17: qty 90, 90d supply, fill #0
  Filled 2024-02-01: qty 90, 90d supply, fill #1
  Filled 2024-04-15: qty 90, 90d supply, fill #2
  Filled 2024-06-17: qty 90, 90d supply, fill #3

## 2023-12-12 ENCOUNTER — Other Ambulatory Visit (HOSPITAL_COMMUNITY): Payer: Self-pay

## 2023-12-12 MED ORDER — ZEPBOUND 12.5 MG/0.5ML ~~LOC~~ SOAJ
12.5000 mg | SUBCUTANEOUS | 0 refills | Status: DC
  Filled 2023-12-12: qty 2, 28d supply, fill #0

## 2024-01-03 ENCOUNTER — Other Ambulatory Visit (HOSPITAL_COMMUNITY): Payer: Self-pay

## 2024-01-03 MED ORDER — WEGOVY 1.7 MG/0.75ML ~~LOC~~ SOAJ
1.7000 mg | SUBCUTANEOUS | 0 refills | Status: DC
  Filled 2024-01-03: qty 3, 28d supply, fill #0

## 2024-01-10 ENCOUNTER — Other Ambulatory Visit (HOSPITAL_COMMUNITY): Payer: Self-pay

## 2024-01-10 ENCOUNTER — Other Ambulatory Visit: Payer: Self-pay | Admitting: Sports Medicine

## 2024-01-10 ENCOUNTER — Other Ambulatory Visit: Payer: Self-pay

## 2024-01-10 DIAGNOSIS — M0579 Rheumatoid arthritis with rheumatoid factor of multiple sites without organ or systems involvement: Secondary | ICD-10-CM

## 2024-01-10 MED ORDER — PREDNISONE 10 MG (48) PO TBPK
ORAL_TABLET | Freq: Every day | ORAL | 0 refills | Status: DC
  Filled 2024-01-10: qty 48, 12d supply, fill #0

## 2024-01-29 ENCOUNTER — Other Ambulatory Visit (HOSPITAL_COMMUNITY): Payer: Self-pay

## 2024-01-29 MED ORDER — WEGOVY 1.7 MG/0.75ML ~~LOC~~ SOAJ
1.7000 mg | SUBCUTANEOUS | 0 refills | Status: DC
  Filled 2024-01-29: qty 3, 28d supply, fill #0

## 2024-02-01 ENCOUNTER — Other Ambulatory Visit (HOSPITAL_COMMUNITY): Payer: Self-pay

## 2024-02-01 MED ORDER — NAPROXEN 500 MG PO TABS
500.0000 mg | ORAL_TABLET | Freq: Every day | ORAL | 0 refills | Status: DC | PRN
  Filled 2024-02-01: qty 90, 90d supply, fill #0

## 2024-02-20 ENCOUNTER — Encounter: Payer: Self-pay | Admitting: Sports Medicine

## 2024-02-26 ENCOUNTER — Other Ambulatory Visit (HOSPITAL_COMMUNITY): Payer: Self-pay

## 2024-02-26 MED ORDER — WEGOVY 2.4 MG/0.75ML ~~LOC~~ SOAJ
2.4000 mg | SUBCUTANEOUS | 0 refills | Status: DC
  Filled 2024-02-26: qty 3, 28d supply, fill #0

## 2024-03-19 ENCOUNTER — Other Ambulatory Visit: Payer: Self-pay

## 2024-03-25 ENCOUNTER — Other Ambulatory Visit (HOSPITAL_COMMUNITY): Payer: Self-pay

## 2024-03-25 ENCOUNTER — Other Ambulatory Visit: Payer: Self-pay | Admitting: Medical-Surgical

## 2024-03-25 DIAGNOSIS — E7849 Other hyperlipidemia: Secondary | ICD-10-CM

## 2024-03-25 MED ORDER — ATORVASTATIN CALCIUM 10 MG PO TABS
10.0000 mg | ORAL_TABLET | Freq: Every day | ORAL | 0 refills | Status: AC
Start: 2024-03-25 — End: ?
  Filled 2024-03-25: qty 90, 90d supply, fill #0

## 2024-04-02 ENCOUNTER — Other Ambulatory Visit (HOSPITAL_COMMUNITY): Payer: Self-pay

## 2024-04-02 MED ORDER — WEGOVY 2.4 MG/0.75ML ~~LOC~~ SOAJ
2.4000 mg | SUBCUTANEOUS | 0 refills | Status: DC
  Filled 2024-04-02: qty 3, 28d supply, fill #0

## 2024-04-15 ENCOUNTER — Other Ambulatory Visit: Payer: Self-pay

## 2024-04-15 ENCOUNTER — Other Ambulatory Visit (HOSPITAL_COMMUNITY): Payer: Self-pay

## 2024-04-15 ENCOUNTER — Other Ambulatory Visit: Payer: Self-pay | Admitting: Medical-Surgical

## 2024-04-15 DIAGNOSIS — M0579 Rheumatoid arthritis with rheumatoid factor of multiple sites without organ or systems involvement: Secondary | ICD-10-CM

## 2024-04-15 DIAGNOSIS — F5101 Primary insomnia: Secondary | ICD-10-CM

## 2024-04-17 ENCOUNTER — Other Ambulatory Visit (HOSPITAL_COMMUNITY): Payer: Self-pay

## 2024-04-17 MED ORDER — TRAZODONE HCL 50 MG PO TABS
50.0000 mg | ORAL_TABLET | Freq: Every day | ORAL | 1 refills | Status: DC
  Filled 2024-04-17: qty 30, 30d supply, fill #0
  Filled 2024-05-22: qty 30, 30d supply, fill #1

## 2024-04-18 ENCOUNTER — Encounter (HOSPITAL_COMMUNITY): Payer: Self-pay

## 2024-04-18 ENCOUNTER — Other Ambulatory Visit (HOSPITAL_COMMUNITY): Payer: Self-pay

## 2024-04-18 ENCOUNTER — Other Ambulatory Visit: Payer: Self-pay

## 2024-04-19 ENCOUNTER — Encounter: Payer: Self-pay | Admitting: Physician Assistant

## 2024-04-19 ENCOUNTER — Ambulatory Visit: Payer: Self-pay

## 2024-04-19 ENCOUNTER — Ambulatory Visit: Admitting: Physician Assistant

## 2024-04-19 VITALS — BP 134/84 | HR 122 | Temp 103.0°F | Ht 72.0 in | Wt 213.0 lb

## 2024-04-19 DIAGNOSIS — N12 Tubulo-interstitial nephritis, not specified as acute or chronic: Secondary | ICD-10-CM

## 2024-04-19 DIAGNOSIS — R509 Fever, unspecified: Secondary | ICD-10-CM | POA: Diagnosis not present

## 2024-04-19 LAB — POCT URINALYSIS DIP (CLINITEK)
Bilirubin, UA: NEGATIVE
Glucose, UA: NEGATIVE mg/dL
Nitrite, UA: NEGATIVE
POC PROTEIN,UA: 100 — AB
Spec Grav, UA: 1.025 (ref 1.010–1.025)
Urobilinogen, UA: 0.2 U/dL
pH, UA: 6 (ref 5.0–8.0)

## 2024-04-19 LAB — POC SOFIA 2 FLU + SARS ANTIGEN FIA
Influenza A, POC: NEGATIVE
Influenza B, POC: NEGATIVE
SARS Coronavirus 2 Ag: NEGATIVE

## 2024-04-19 LAB — POCT RAPID STREP A (OFFICE): Rapid Strep A Screen: NEGATIVE

## 2024-04-19 MED ORDER — CIPROFLOXACIN HCL 500 MG PO TABS: 500.0000 mg | ORAL_TABLET | Freq: Two times a day (BID) | ORAL | 0 refills | Status: AC

## 2024-04-19 MED ORDER — CEFTRIAXONE SODIUM 1 G IJ SOLR
1.0000 g | Freq: Once | INTRAMUSCULAR | Status: AC
  Administered 2024-04-19: 1 g via INTRAMUSCULAR

## 2024-04-19 NOTE — Progress Notes (Signed)
 Acute Office Visit  Subjective:     Patient ID: Andre Cohen, male    DOB: Nov 11, 1970, 53 y.o.   MRN: [id number]  Chief Complaint  Patient presents with   Fever    HPI Discussed the use of AI scribe software for clinical note transcription with the patient, who gave verbal consent to proceed.  History of Present Illness Andre Cohen is a 53 year old male with rheumatoid arthritis who presents with symptoms of fever and extreme fatigue.   Constitutional symptoms - Onset of symptoms last night around 6 PM - Fever with temperature reaching 102F to 103F - Chills present - Severe fatigue, requiring an hour and a half to gather energy to drink water - no URI symptoms - no sick contacts  Genitourinary symptoms - Back pain after urination onset last night - Sensation of incomplete bladder emptying - No abdominal pain - No pain with urination - No changes in bowel movements - Urine test positive for blood and leukocytes - No history of kidney stones - Uncircumcised  Hydration status - Typically drinks a gallon of water daily - Reduced fluid intake recently due to current illness  Rheumatologic history - Rheumatoid arthritis and osteoarthritis diagnosed - No cartilage in knees - Immunosuppressant therapy (Humira ) discontinued 2-3 years ago - Manages occasional flare-ups with prednisone  - Flare-ups occur approximately every 18 months     ROS See HPI.      Objective:    BP 134/84   Pulse (!) 122   Temp (!) 103 F (39.4 C) (Oral)   Ht 6' (1.829 m)   Wt 213 lb (96.6 kg)   SpO2 96%   BMI 28.89 kg/m  BP Readings from Last 3 Encounters:  04/19/24 134/84  07/07/23 132/87  03/22/23 126/83   Wt Readings from Last 3 Encounters:  04/19/24 213 lb (96.6 kg)  07/07/23 227 lb (103 kg)  03/22/23 223 lb (101.2 kg)      Physical Exam Constitutional:      Appearance: He is ill-appearing.  HENT:     Head: Normocephalic.  Cardiovascular:      Rate and Rhythm: Tachycardia present.  Pulmonary:     Effort: Pulmonary effort is normal.  Abdominal:     General: Bowel sounds are normal. There is no distension.     Palpations: Abdomen is soft. There is no mass.     Tenderness: There is no abdominal tenderness. There is no right CVA tenderness, left CVA tenderness, guarding or rebound.  Musculoskeletal:     Right lower leg: No edema.     Left lower leg: No edema.  Skin:    Coloration: Skin is pale.  Neurological:     General: No focal deficit present.     Mental Status: He is oriented to person, place, and time.  Psychiatric:        Mood and Affect: Mood normal.     Results for orders placed or performed in visit on 04/19/24  POC SOFIA 2 FLU + SARS ANTIGEN FIA  Result Value Ref Range   Influenza A, POC Negative Negative   Influenza B, POC Negative Negative   SARS Coronavirus 2 Ag Negative Negative  POCT rapid strep A  Result Value Ref Range   Rapid Strep A Screen Negative Negative  POCT URINALYSIS DIP (CLINITEK)  Result Value Ref Range   Color, UA yellow yellow   Clarity, UA clear clear   Glucose, UA negative negative mg/dL   Bilirubin, UA  negative negative   Ketones, POC UA small (15) (A) negative mg/dL   Spec Grav, UA 8.974 8.989 - 1.025   Blood, UA large (A) negative   pH, UA 6.0 5.0 - 8.0   POC PROTEIN,UA =100 (A) negative, trace   Urobilinogen, UA 0.2 0.2 or 1.0 E.U./dL   Nitrite, UA Negative Negative   Leukocytes, UA Large (3+) (A) Negative       Assessment & Plan:  SABRASABRAAhyan Kreeger was seen today for fever.  Diagnoses and all orders for this visit:  Pyelonephritis -     Urine Culture -     CBC w/Diff/Platelet -     CMP14+EGFR -     ciprofloxacin (CIPRO) 500 MG tablet; Take 1 tablet (500 mg total) by mouth 2 (two) times daily for 10 days. -     cefTRIAXone (ROCEPHIN) injection 1 g -     US  Renal; Future  Fever, unspecified fever cause -     POC SOFIA 2 FLU + SARS ANTIGEN FIA -     POCT rapid  strep A -     POCT URINALYSIS DIP (CLINITEK) -     Urine Culture -     CBC w/Diff/Platelet -     CMP14+EGFR   Assessment & Plan Acute pyelonephritis with fever Acute pyelonephritis with fever, risk of sepsis due to kidney infection.  Tachycardic and febrile on exam today  Aggressive treatment necessary due to family history and rapid onset. Kidney stones unlikely due to no flank pain or CVA tenderness. UA today showed blood, protein,leukocytes.  Will culture.  Flu/covid/strep negative today - Administered Rocephin 1 gram IM. - Started Cipro 500 mg BID for 10 days. - Ordered blood work for WBC count and kidney function. - Ordered renal ultrasound to evaluate for nephrolithiasis or abnormalities. - Advised rest and hydration. - Provided on-call physician contact for weekend concerns. - Discussed antipyretics for fever >101-103F. - If symptoms worsening go to ED.  - Advised to be afebrile for 24 hours and have 3-4 doses of antibiotics before resuming travel.  Rheumatoid arthritis Flares approximately every 18 months. Prefers to manage without immunosuppressants. No current flare. - Continue management without immunosuppressants.     Mianna Iezzi, PA-C

## 2024-04-19 NOTE — Patient Instructions (Addendum)
 Start cipro tomorrow morning U/S of kidneys ordered Go to ED if fever persist or worsening symptoms.   Pyelonephritis, Adult Pyelonephritis is an infection that occurs in the kidney. The kidneys are the organs that filter the blood and move waste from the bloodstream to the urine. Urine passes out of the kidneys through tubes called ureters and goes into the bladder. There are two main types of this condition: Acute pyelonephritis. These are infections that come on quickly and without any warning. Chronic pyelonephritis. These infections last for a long time. In most cases, the infection clears up with treatment. In more severe cases, an infection can spread to the bloodstream or lead to other problems with the kidneys. What are the causes? This condition is often caused by bacteria. The bacteria may travel: From the bladder up to the kidney. This may happen after you have a bladder infection (cystitis) or urinary tract infection (UTI). From the bloodstream to the kidney. What increases the risk? You are more likely to develop this condition if: You are male. Your risk is even higher if you are pregnant. You are older. You have: Diabetes. Prostatitis. This is inflammation of the prostate gland. Kidney stones or bladder stones. Problems with your kidneys or ureters. Cancer. Spinal cord injury or nerve damage around the bladder. You have a soft tube (catheter) placed in your bladder. You are sexually active and use spermicides. You have or have had a UTI. What are the signs or symptoms? Symptoms of this condition include: An urge to urinate that is strong or does not go away. You may also urinate more often than normal. A burning or stinging feeling when you urinate. Pain. This may be in your abdomen, back, side, or groin. Fever or chills. Nausea or vomiting. Urine that is bloody, dark, cloudy, or smells bad. How is this diagnosed? This condition may be diagnosed based on your  medical history and a physical exam. You may also have tests, such as: Urine tests. Blood tests. Imaging tests of the kidneys. These may include an ultrasound or CT scan. How is this treated? Treatment for this condition depends on the severity of the infection. If the infection is mild and found early, you may be given antibiotics to take by mouth. You will need to drink lots of fluids. If the infection is more severe, you may need to stay in the hospital and receive antibiotics through an IV. You may also get fluids through an IV. After you leave the hospital, you may need to take antibiotics by mouth. Other treatments may be needed. These will depend on the cause of the infection. Follow these instructions at home: Eating and drinking Drink enough fluid to keep your urine pale yellow. Avoid caffeine, tea, and carbonated drinks. These can irritate the bladder. General instructions Take over-the-counter and prescription medicines as told by your health care provider. Finish your antibiotics even if you start to feel better. Urinate often. Avoid holding in urine for long periods of time. Urinate before and after sex. If you are male, cleanse from front to back after a bowel movement. Use each tissue only once. Keep all follow-up visits. Your health care provider will want to make sure your infection is gone. Contact a health care provider if: Your symptoms do not get better after 2 days of treatment. Your symptoms get worse. You have a fever or chills. You cannot take your antibiotics. Get help right away if: You vomit each time that you eat or drink.  You have severe pain in your back or side. You are very weak, or you faint. This information is not intended to replace advice given to you by your health care provider. Make sure you discuss any questions you have with your health care provider. Document Revised: 12/27/2021 Document Reviewed: 12/27/2021 Elsevier Patient Education  2024  [employer].

## 2024-04-19 NOTE — Telephone Encounter (Signed)
 FYI Only or Action Required?: FYI only for provider: appointment scheduled on 04/19/2024.  Patient was last seen in primary care on 11/15/2023 by Curtis Debby PARAS, MD.  Called Nurse Triage reporting Fever.  Symptoms began yesterday.  Interventions attempted: OTC medications: Tylenol .  Symptoms are: gradually worsening.  Triage Disposition: See HCP Within 4 Hours (Or PCP Triage)  Patient/caregiver understands and will follow disposition?: Yes           Copied from CRM #[id number]. Topic: Clinical - Red Word Triage >> Apr 19, 2024  8:46 AM Amy B wrote: Red Word that prompted transfer to Nurse Triage: Fever, back pain, chills Reason for Disposition  SEVERE chills (i.e., feeling extremely cold WITH shaking chills)  Answer Assessment - Initial Assessment Questions 1. TEMPERATURE: What is the most recent temperature?  How was it measured?      101.3  2. ONSET: When did the fever start?      Last night  3. CHILLS: Do you have chills? If yes: How bad are they?  (e.g., none, mild, moderate, severe)     Yes,  4. OTHER SYMPTOMS: Do you have any other symptoms besides the fever?  (e.g., abdomen pain, cough, diarrhea, earache, headache, sore throat, urination pain)     Back pain  5. CAUSE: If there are no symptoms, ask: What do you think is causing the fever?      Kidney infection  7. TREATMENT: What have you done so far to treat this fever? (e.g., OTC fever medicines)     Tylenol   This RN spoke with the pt's wife regarding symptoms.  She states the pt has severe chills and he feels like he is having difficulty emptying his bladder.  Protocols used: [facility]

## 2024-04-20 LAB — CBC WITH DIFFERENTIAL/PLATELET
Basophils Absolute: 0 x10E3/uL (ref 0.0–0.2)
Basos: 0 %
EOS (ABSOLUTE): 0 x10E3/uL (ref 0.0–0.4)
Eos: 0 %
Hematocrit: 39.8 % (ref 37.5–51.0)
Hemoglobin: 13.3 g/dL (ref 13.0–17.7)
Immature Grans (Abs): 0.1 x10E3/uL (ref 0.0–0.1)
Immature Granulocytes: 1 %
Lymphocytes Absolute: 1.8 x10E3/uL (ref 0.7–3.1)
Lymphs: 11 %
MCH: 31.8 pg (ref 26.6–33.0)
MCHC: 33.4 g/dL (ref 31.5–35.7)
MCV: 95 fL (ref 79–97)
Monocytes Absolute: 1.1 x10E3/uL — ABNORMAL HIGH (ref 0.1–0.9)
Monocytes: 7 %
Neutrophils Absolute: 13.3 x10E3/uL — ABNORMAL HIGH (ref 1.4–7.0)
Neutrophils: 81 %
Platelets: 225 x10E3/uL (ref 150–450)
RBC: 4.18 x10E6/uL (ref 4.14–5.80)
RDW: 12.2 % (ref 11.6–15.4)
WBC: 16.4 x10E3/uL — ABNORMAL HIGH (ref 3.4–10.8)

## 2024-04-20 LAB — CMP14+EGFR
ALT: 16 IU/L (ref 0–44)
AST: 18 IU/L (ref 0–40)
Albumin: 4.4 g/dL (ref 3.8–4.9)
Alkaline Phosphatase: 72 IU/L (ref 47–123)
BUN/Creatinine Ratio: 14 (ref 9–20)
BUN: 14 mg/dL (ref 6–24)
Bilirubin Total: 0.8 mg/dL (ref 0.0–1.2)
CO2: 22 mmol/L (ref 20–29)
Calcium: 9.6 mg/dL (ref 8.7–10.2)
Chloride: 99 mmol/L (ref 96–106)
Creatinine, Ser: 0.98 mg/dL (ref 0.76–1.27)
Globulin, Total: 2.5 g/dL (ref 1.5–4.5)
Glucose: 129 mg/dL — ABNORMAL HIGH (ref 70–99)
Potassium: 4 mmol/L (ref 3.5–5.2)
Sodium: 135 mmol/L (ref 134–144)
Total Protein: 6.9 g/dL (ref 6.0–8.5)
eGFR: 91 mL/min/1.73 (ref >59)

## 2024-04-22 ENCOUNTER — Ambulatory Visit: Payer: Self-pay | Admitting: Physician Assistant

## 2024-04-22 DIAGNOSIS — N400 Enlarged prostate without lower urinary tract symptoms: Secondary | ICD-10-CM

## 2024-04-22 LAB — URINE CULTURE

## 2024-04-22 NOTE — Progress Notes (Signed)
 E.coli found in urine culture. Cipro should treat. Continue antibiotic. Waiting for full culture report. WBC elevated as suspected. How are you feeling?

## 2024-04-23 ENCOUNTER — Ambulatory Visit (INDEPENDENT_AMBULATORY_CARE_PROVIDER_SITE_OTHER)

## 2024-04-23 DIAGNOSIS — N12 Tubulo-interstitial nephritis, not specified as acute or chronic: Secondary | ICD-10-CM

## 2024-04-30 DIAGNOSIS — N400 Enlarged prostate without lower urinary tract symptoms: Secondary | ICD-10-CM | POA: Insufficient documentation

## 2024-04-30 NOTE — Progress Notes (Signed)
 Dan,   Kidneys look good.  Bladder looks good.  Your prostate is large. I do not see a PSA since 2024. I would like to check this.   I would like to get a nurse to ask you some questions about your prostate health.(AUA). They will give you a call. Do you feel better right now?

## 2024-05-02 ENCOUNTER — Telehealth: Payer: Self-pay | Admitting: Sports Medicine

## 2024-05-02 NOTE — Telephone Encounter (Signed)
 Patient is scheduled

## 2024-05-02 NOTE — Telephone Encounter (Signed)
 Copied from CRM [id number]. Topic: Appointments - Scheduling Inquiry for Clinic >> May 02, 2024  8:08 AM Tobias CROME wrote: Reason for CRM: Patient needing physical, patient of Dr. ONEIDA. Requesting TOC appointment with Zada Palin, FNP.   Wife requesting call to schedule appointment, [phone]

## 2024-05-07 NOTE — Progress Notes (Signed)
 Does he want to follow up with me? If so ok to schedule first of December and can discuss prostate enlargement. He is not Joy's patient and this is continuation of care.

## 2024-05-07 NOTE — Telephone Encounter (Signed)
 Added AUA results to  flowsheet on OV from 04/19/2024 - hard copy placed in Jade Breeback's in basket for review.  Patient states he does not have any prostate issues or symptoms with bladder.  Do you still want to have patient return for PSA lab work ?  Patient is scheduled 05/13/2024 for TOC to Zada Palin, NP

## 2024-05-08 ENCOUNTER — Other Ambulatory Visit (HOSPITAL_COMMUNITY): Payer: Self-pay

## 2024-05-08 MED ORDER — MOUNJARO 7.5 MG/0.5ML ~~LOC~~ SOAJ
7.5000 mg | SUBCUTANEOUS | 0 refills | Status: DC
  Filled 2024-05-08: qty 2, 28d supply, fill #0

## 2024-05-09 ENCOUNTER — Other Ambulatory Visit (HOSPITAL_COMMUNITY): Payer: Self-pay

## 2024-05-13 ENCOUNTER — Encounter: Admitting: Medical-Surgical

## 2024-05-22 ENCOUNTER — Other Ambulatory Visit (HOSPITAL_COMMUNITY): Payer: Self-pay

## 2024-05-22 ENCOUNTER — Ambulatory Visit: Admitting: Physician Assistant

## 2024-05-22 ENCOUNTER — Encounter: Payer: Self-pay | Admitting: Physician Assistant

## 2024-05-22 VITALS — BP 130/70 | HR 68 | Ht 72.0 in | Wt 216.0 lb

## 2024-05-22 DIAGNOSIS — R972 Elevated prostate specific antigen [PSA]: Secondary | ICD-10-CM

## 2024-05-22 DIAGNOSIS — G4733 Obstructive sleep apnea (adult) (pediatric): Secondary | ICD-10-CM | POA: Diagnosis not present

## 2024-05-22 DIAGNOSIS — I10 Essential (primary) hypertension: Secondary | ICD-10-CM

## 2024-05-22 DIAGNOSIS — F900 Attention-deficit hyperactivity disorder, predominantly inattentive type: Secondary | ICD-10-CM | POA: Insufficient documentation

## 2024-05-22 DIAGNOSIS — Z0000 Encounter for general adult medical examination without abnormal findings: Secondary | ICD-10-CM | POA: Diagnosis not present

## 2024-05-22 DIAGNOSIS — R7303 Prediabetes: Secondary | ICD-10-CM

## 2024-05-22 DIAGNOSIS — M0579 Rheumatoid arthritis with rheumatoid factor of multiple sites without organ or systems involvement: Secondary | ICD-10-CM

## 2024-05-22 DIAGNOSIS — E782 Mixed hyperlipidemia: Secondary | ICD-10-CM

## 2024-05-22 DIAGNOSIS — Z23 Encounter for immunization: Secondary | ICD-10-CM

## 2024-05-22 MED ORDER — AMPHETAMINE-DEXTROAMPHET ER 10 MG PO CP24
10.0000 mg | ORAL_CAPSULE | Freq: Every day | ORAL | 0 refills | Status: DC
  Filled 2024-05-22: qty 30, 30d supply, fill #0

## 2024-05-22 NOTE — Progress Notes (Signed)
 Complete physical exam  Patient: Andre Cohen   DOB: [birth year]   53 y.o. Male  MRN: [id number]  Subjective:    Chief Complaint  Patient presents with   Annual Exam   ..Discussed the use of AI scribe software for clinical note transcription with the patient, who gave verbal consent to proceed.  History of Present Illness Andre Cohen is a 53 year old male with hypertension and rheumatoid arthritis who presents to the clinic for annual exam.   Hypertension and antihypertensive medication side effects - Discontinued antihypertensive medication due to sexual side effects; unable to recall medication name - Sexual side effects resolved after discontinuation - No current antihypertensive therapy - No home blood pressure monitoring - Occasional headaches - No chest pain or shortness of breath  Weight gain and weight management - Recent weight gain of approximately 10 pounds, attributed to decreased physical activity and holiday eating - Current weight typically between 220 and 230 pounds; previously reduced to 195-199 pounds with diet and exercise - Currently taking tirzepatide  7.5mg  weekly. - Previously trialed Wegovy  - Diet includes protein shake and sensible lunch, with occasional indulgences (e.g., donuts)  Rheumatoid arthritis symptoms and management - History of rheumatoid arthritis with only one flare-up in the past two years - No regular disease-modifying therapy - Uses naproxen  500 mg as needed for flare-ups, typically once in the morning and once at night - Backup prescription for prednisone  burst packs, used approximately twice per year for severe flare-ups - Alcohol, especially in larger quantities, can trigger symptoms; limits intake to about four beers per week - No major arthritis symptoms aside from RA  Attention deficit hyperactivity disorder (adhd) symptoms - History of ADHD, previously treated with medication in youth - No ADHD  medication use in adulthood - 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 0  Number falls in past yr: 0  Injury with Fall? 0  Follow up Falls evaluation completed     Most recent depression screenings:    05/22/2024    3:28 PM 03/22/2023    9:13 AM  PHQ 2/9 Scores  PHQ - 2 Score 0 0  PHQ- 9 Score 0     Vision:Within last year, Dental: No current dental problems and Receives regular dental care, and PSA: Agrees to PSA testing  Patient Active Problem List   Diagnosis Date Noted   OSA on CPAP 05/22/2024   Attention deficit hyperactivity disorder (ADHD), predominantly inattentive type 05/22/2024   Enlarged prostate 04/30/2024   Fever 04/19/2024   Pyelonephritis 04/19/2024   Poor concentration 11/15/2023   Numbness and tingling of left worse than right thumbs 11/15/2023   Tibial mass, left 10/05/2023   Insomnia 10/04/2023   Patellofemoral arthritis of left knee 10/04/2023   Obesity 07/07/2023   Benign essential hypertension 08/13/2021   Benign paroxysmal positional vertigo 07/13/2021   Lumbar strain 12/10/2020   IBS (irritable bowel syndrome) 07/14/2016   Elevated PSA 05/16/2016   Rheumatoid arthritis, seropositive, multiple sites (HCC) 05/10/2016   Right shoulder pain 05/22/2015   Sleep apnea 03/17/2015   Hyperlipidemia 08/21/2014   Prediabetes 08/21/2014   Annual physical exam 08/20/2014   Steatohepatitis, non-alcoholic 08/20/2014   Past Medical History:  Diagnosis Date   Asthma    Carpal tunnel syndrome on both  sides    Hyperlipidemia    LFT elevation    VITAMIN USE IN THE PAST   RA (rheumatoid arthritis) (HCC)    Sleep apnea    wear CPAP nightly   Past Surgical History:  Procedure Laterality Date   CARPAL TUNNEL RELEASE Right 04/15/2016   Procedure: CARPAL TUNNEL RELEASE, right;  Surgeon: Arley Curia,  MD;  Location: [facility];  Service: Orthopedics;  Laterality: Right;  FAB   WISDOM TOOTH EXTRACTION     Family History  Problem Relation Age of Onset   Hypertension Mother    Hypertension Father    Hypertension Sister    Colon polyps Sister    Hypertension Brother    Cancer Brother        skin   Colon cancer Neg Hx    Esophageal cancer Neg Hx    Rectal cancer Neg Hx    Stomach cancer Neg Hx    Allergies  Allergen Reactions   Doxycycline Rash      Patient Care Team: Jolon Degante L, PA-C as PCP - General (Family Medicine)   Outpatient Medications Prior to Visit  Medication Sig   albuterol  (PROVENTIL  HFA;VENTOLIN  HFA) 108 (90 BASE) MCG/ACT inhaler Inhale 2 puffs into the lungs every 6 (six) hours as needed for wheezing or shortness of breath.   AMBULATORY NON FORMULARY MEDICATION Continuous positive airway pressure (CPAP) machine on auto pressure titration, if unable the set at 12 cm of H2O pressure, with all supplemental supplies as needed.   AMBULATORY NON FORMULARY MEDICATION Continuous positive airway pressure (CPAP) machine set on AutoPAP (4-20 cmH2O), with all supplemental supplies as needed.   atorvastatin  (LIPITOR) 10 MG tablet Take 1 tablet (10 mg total) by mouth daily.   ipratropium (ATROVENT ) 0.06 % nasal spray Place 2 sprays into both nostrils 4 (four) times daily. For 1 week   naproxen  (NAPROSYN ) 500 MG tablet Take 1 tablet (500 mg total) by mouth daily as needed with food or milk.   tirzepatide  (MOUNJARO ) 7.5 MG/0.5ML Pen Inject 7.5 mg into the skin every 7 (seven) days.   traZODone  (DESYREL ) 50 MG tablet Take 1 tablet (50 mg total) by mouth at bedtime. NEEDS TO TRANSITION CARE TO NEW PCP BEFORE NEXT RF   valACYclovir  (VALTREX ) 1000 MG tablet Take 1 tablet (1,000 mg total) by mouth daily as needed.   [DISCONTINUED] predniSONE  (STERAPRED UNI-PAK 48 TAB) 10 MG (48) TBPK tablet Take as directed on package   [DISCONTINUED] WEGOVY  1.7 MG/0.75ML SOAJ SQ  injection Inject 1.7 mg into the skin once a week.   [DISCONTINUED] WEGOVY  2.4 MG/0.75ML SOAJ SQ injection Inject 2.4 mg into the skin once a week.   No facility-administered medications prior to visit.    Review of Systems  All other systems reviewed and are negative.         Objective:     BP 130/70   Pulse 68   Ht 6' (1.829 m)   Wt 216 lb (98 kg)   SpO2 100%   BMI 29.29 kg/m  BP Readings from Last 3 Encounters:  05/22/24 130/70  04/19/24 134/84  07/07/23 132/87   Wt Readings from Last 3 Encounters:  05/22/24 216 lb (98 kg)  04/19/24 213 lb (96.6 kg)  07/07/23 227 lb (103 kg)      Physical Exam  BP 130/70   Pulse 68   Ht 6' (1.829 m)   Wt 216 lb (98 kg)   SpO2 100%   BMI 29.29 kg/m  General Appearance:    Alert, cooperative, no distress, appears stated age  Head:    Normocephalic, without obvious abnormality, atraumatic  Eyes:    PERRL, conjunctiva/corneas clear, EOM's intact, fundi    benign, both eyes       Ears:    Normal TM's and external ear canals, both ears  Nose:   Nares normal, septum midline, mucosa normal, no drainage    or sinus tenderness  Throat:   Lips, mucosa, and tongue normal; teeth and gums normal  Neck:   Supple, symmetrical, trachea midline, no adenopathy;       thyroid :  No enlargement/tenderness/nodules; no carotid   bruit or JVD  Back:     Symmetric, no curvature, ROM normal, no CVA tenderness  Lungs:     Clear to auscultation bilaterally, respirations unlabored  Chest wall:    No tenderness or deformity  Heart:    Regular rate and rhythm, S1 and S2 normal, no murmur, rub   or gallop  Abdomen:     Soft, non-tender, bowel sounds active all four quadrants,    no masses, no organomegaly        Extremities:   Extremities normal, atraumatic, no cyanosis or edema  Pulses:   2+ and symmetric all extremities  Skin:   Skin color, texture, turgor normal, no rashes or lesions  Lymph nodes:   Cervical, supraclavicular, and axillary  nodes normal  Neurologic:   CNII-XII intact. Normal strength, sensation and reflexes      throughout      Assessment & Plan:    Routine Health Maintenance and Physical Exam  Immunization History  Administered Date(s) Administered   Influenza, Mdck, Trivalent,PF 6+ MOS(egg free) 03/27/2017   Influenza, Seasonal, Injecte, Preservative Fre 03/22/2023, 05/22/2024   Influenza,inj,Quad PF,6+ Mos 05/22/2015, 05/04/2018, 03/25/2019, 04/22/2020   Influenza-Unspecified 03/27/2017   PFIZER(Purple Top)SARS-COV-2 Vaccination 08/29/2019, 09/23/2019, 06/06/2020   Pneumococcal Polysaccharide-23 07/14/2016   Tdap 10/05/2008, 11/12/2019   Zoster Recombinant(Shingrix ) 03/22/2023, 06/26/2023    Health Maintenance  Topic Date Due   Pneumococcal Vaccine: 50+ Years (2 of 2 - PCV) 04/19/2025 (Originally 07/14/2017)   Hepatitis B Vaccines 19-59 Average Risk (1 of 3 - 19+ 3-dose series) 04/19/2025 (Originally 07/25/1987)   HIV Screening  05/22/2025 (Originally 07/25/1983)   Colonoscopy  11/14/2027   DTaP/Tdap/Td (3 - Td or Tdap) 11/11/2029   Influenza Vaccine  Completed   Hepatitis C Screening  Completed   Zoster Vaccines- Shingrix   Completed   HPV VACCINES  Aged Out   Meningococcal B Vaccine  Aged Out   COVID-19 Vaccine  Discontinued    Discussed health benefits of physical activity, and encouraged him to engage in regular exercise appropriate for his age and condition. SABRADelroy Share was seen today for annual exam.  Diagnoses and all orders for this visit:  Annual physical exam -     Flu vaccine trivalent PF, 6mos and older(Flulaval,Afluria,Fluarix,Fluzone) -     CMP14+EGFR -     Hemoglobin A1c -     CBC w/Diff/Platelet -     PSA -     Lipid panel -     VITAMIN D  25 Hydroxy (Vit-D Deficiency, Fractures) -     TSH + free T4  OSA on CPAP  Need for influenza vaccination -     Flu vaccine trivalent PF, 6mos and older(Flulaval,Afluria,Fluarix,Fluzone)  Elevated PSA -     PSA  Benign  essential hypertension -     CMP14+EGFR  Prediabetes -  Hemoglobin A1c  Mixed hyperlipidemia -     Lipid panel  Attention deficit hyperactivity disorder (ADHD), predominantly inattentive type -     amphetamine -dextroamphetamine  (ADDERALL XR) 10 MG 24 hr capsule; Take 1 capsule (10 mg total) by mouth daily.   Assessment & Plan Adult Wellness Visit Routine wellness visit focused on lifestyle modifications for health. - Encouraged regular exercise and healthy diet. - Fasting labs ordered with PSA for screening.  - UTD colonoscopy.  - Flu shot given today. Declined Pneumonia vaccine.   Hypertension Blood pressure control is crucial. Prefers lifestyle modifications but open to alternative medications due to previous side effects. - Reviewed previous antihypertensive medication. - Consider alternative antihypertensive medication from a different class. - Encouraged home blood pressure monitoring. - 2nd BP check improved a lot. Goal BP under 140/90.   Rheumatoid arthritis Well-controlled with minimal flare-ups. Prefers quality of life over long-term medication use. - Continue naproxen  for flare-ups. - Use prednisone  bursts as needed for severe flare-ups. - Encouraged anti-inflammatory diet and lifestyle modifications. - managed by rheumatology.   Prediabetes Managed with lifestyle modifications and Tirzepetide for weight management. - Encouraged continued lifestyle modifications for weight management.  Attention-deficit hyperactivity disorder, predominantly inattentive type Symptoms resurfaced with new job. Considering treatment but concerned about side effects and interactions with blood pressure medications. - Consider ADHD treatment after blood pressure is controlled. - Monitor for potential side effects of stimulant medications. - Follow up in 4 weeks after taking Adderall to make sure BP and symptoms are managed.     Return in about 4 weeks (around 06/19/2024) for BP  check.     Laela Deviney, PA-C

## 2024-05-22 NOTE — Patient Instructions (Addendum)
 Start adderall daily in morning.   Health Maintenance, Male Adopting a healthy lifestyle and getting preventive care are important in promoting health and wellness. Ask your health care provider about: The right schedule for you to have regular tests and exams. Things you can do on your own to prevent diseases and keep yourself healthy. What should I know about diet, weight, and exercise? Eat a healthy diet  Eat a diet that includes plenty of vegetables, fruits, low-fat dairy products, and lean protein. Do not eat a lot of foods that are high in solid fats, added sugars, or sodium. Maintain a healthy weight Body mass index (BMI) is a measurement that can be used to identify possible weight problems. It estimates body fat based on height and weight. Your health care provider can help determine your BMI and help you achieve or maintain a healthy weight. Get regular exercise Get regular exercise. This is one of the most important things you can do for your health. Most adults should: Exercise for at least 150 minutes each week. The exercise should increase your heart rate and make you sweat (moderate-intensity exercise). Do strengthening exercises at least twice a week. This is in addition to the moderate-intensity exercise. Spend less time sitting. Even light physical activity can be beneficial. Watch cholesterol and blood lipids Have your blood tested for lipids and cholesterol at 55 years of age, then have this test every 5 years. You may need to have your cholesterol levels checked more often if: Your lipid or cholesterol levels are high. You are older than 55 years of age. You are at high risk for heart disease. What should I know about cancer screening? Many types of cancers can be detected early and may often be prevented. Depending on your health history and family history, you may need to have cancer screening at various ages. This may include screening for: Colorectal  cancer. Prostate cancer. Skin cancer. Lung cancer. What should I know about heart disease, diabetes, and high blood pressure? Blood pressure and heart disease High blood pressure causes heart disease and increases the risk of stroke. This is more likely to develop in people who have high blood pressure readings or are overweight. Talk with your health care provider about your target blood pressure readings. Have your blood pressure checked: Every 3-5 years if you are 62-21 years of age. Every year if you are 8 years old or older. If you are between the ages of 49 and 68 and are a current or former smoker, ask your health care provider if you should have a one-time screening for abdominal aortic aneurysm (AAA). Diabetes Have regular diabetes screenings. This checks your fasting blood sugar level. Have the screening done: Once every three years after age 77 if you are at a normal weight and have a low risk for diabetes. More often and at a younger age if you are overweight or have a high risk for diabetes. What should I know about preventing infection? Hepatitis B If you have a higher risk for hepatitis B, you should be screened for this virus. Talk with your health care provider to find out if you are at risk for hepatitis B infection. Hepatitis C Blood testing is recommended for: Everyone born from [birth year] through 1967. Anyone with known risk factors for hepatitis C. Sexually transmitted infections (STIs) You should be screened each year for STIs, including gonorrhea and chlamydia, if: You are sexually active and are younger than 55 years of age. You are older  than 55 years of age and your health care provider tells you that you are at risk for this type of infection. Your sexual activity has changed since you were last screened, and you are at increased risk for chlamydia or gonorrhea. Ask your health care provider if you are at risk. Ask your health care provider about whether you are at  high risk for HIV. Your health care provider may recommend a prescription medicine to help prevent HIV infection. If you choose to take medicine to prevent HIV, you should first get tested for HIV. You should then be tested every 3 months for as long as you are taking the medicine. Follow these instructions at home: Alcohol use Do not drink alcohol if your health care provider tells you not to drink. If you drink alcohol: Limit how much you have to 0-2 drinks a day. Know how much alcohol is in your drink. In the [place] one drink equals one 12 oz bottle of beer (355 mL), one 5 oz glass of wine (148 mL), or one 1 oz glass of hard liquor (44 mL). Lifestyle Do not use any products that contain nicotine or tobacco. These products include cigarettes, chewing tobacco, and vaping devices, such as e-cigarettes. If you need help quitting, ask your health care provider. Do not use street drugs. Do not share needles. Ask your health care provider for help if you need support or information about quitting drugs. General instructions Schedule regular health, dental, and eye exams. Stay current with your vaccines. Tell your health care provider if: You often feel depressed. You have ever been abused or do not feel safe at home. Summary Adopting a healthy lifestyle and getting preventive care are important in promoting health and wellness. Follow your health care provider's instructions about healthy diet, exercising, and getting tested or screened for diseases. Follow your health care provider's instructions on monitoring your cholesterol and blood pressure. This information is not intended to replace advice given to you by your health care provider. Make sure you discuss any questions you have with your health care provider. Document Revised: 10/26/2020 Document Reviewed: 10/26/2020 Elsevier Patient Education  2024 [employer].

## 2024-05-24 ENCOUNTER — Encounter: Payer: Self-pay | Admitting: Physician Assistant

## 2024-06-06 ENCOUNTER — Ambulatory Visit: Payer: Self-pay | Admitting: Physician Assistant

## 2024-06-06 ENCOUNTER — Other Ambulatory Visit (HOSPITAL_COMMUNITY): Payer: Self-pay

## 2024-06-06 ENCOUNTER — Other Ambulatory Visit: Payer: Self-pay

## 2024-06-06 DIAGNOSIS — F5101 Primary insomnia: Secondary | ICD-10-CM

## 2024-06-06 DIAGNOSIS — E7849 Other hyperlipidemia: Secondary | ICD-10-CM

## 2024-06-06 LAB — CBC WITH DIFFERENTIAL/PLATELET
Basophils Absolute: 0.1 x10E3/uL (ref 0.0–0.2)
Basos: 1 %
EOS (ABSOLUTE): 0.1 x10E3/uL (ref 0.0–0.4)
Eos: 1 %
Hematocrit: 44.8 % (ref 37.5–51.0)
Hemoglobin: 14.8 g/dL (ref 13.0–17.7)
Immature Grans (Abs): 0 x10E3/uL (ref 0.0–0.1)
Immature Granulocytes: 0 %
Lymphocytes Absolute: 3 x10E3/uL (ref 0.7–3.1)
Lymphs: 41 %
MCH: 31.7 pg (ref 26.6–33.0)
MCHC: 33 g/dL (ref 31.5–35.7)
MCV: 96 fL (ref 79–97)
Monocytes Absolute: 0.5 x10E3/uL (ref 0.1–0.9)
Monocytes: 7 %
Neutrophils Absolute: 3.7 x10E3/uL (ref 1.4–7.0)
Neutrophils: 50 %
Platelets: 273 x10E3/uL (ref 150–450)
RBC: 4.67 x10E6/uL (ref 4.14–5.80)
RDW: 12.7 % (ref 11.6–15.4)
WBC: 7.4 x10E3/uL (ref 3.4–10.8)

## 2024-06-06 LAB — CMP14+EGFR
ALT: 20 IU/L (ref 0–44)
AST: 24 IU/L (ref 0–40)
Albumin: 4.6 g/dL (ref 3.8–4.9)
Alkaline Phosphatase: 67 IU/L (ref 47–123)
BUN/Creatinine Ratio: 11 (ref 9–20)
BUN: 11 mg/dL (ref 6–24)
Bilirubin Total: 0.4 mg/dL (ref 0.0–1.2)
CO2: 26 mmol/L (ref 20–29)
Calcium: 9.9 mg/dL (ref 8.7–10.2)
Chloride: 101 mmol/L (ref 96–106)
Creatinine, Ser: 1.03 mg/dL (ref 0.76–1.27)
Globulin, Total: 2.7 g/dL (ref 1.5–4.5)
Glucose: 102 mg/dL — ABNORMAL HIGH (ref 70–99)
Potassium: 4.9 mmol/L (ref 3.5–5.2)
Sodium: 141 mmol/L (ref 134–144)
Total Protein: 7.3 g/dL (ref 6.0–8.5)
eGFR: 86 mL/min/1.73 (ref >59)

## 2024-06-06 LAB — VITAMIN D 25 HYDROXY (VIT D DEFICIENCY, FRACTURES): Vit D, 25-Hydroxy: 44.2 ng/mL (ref 30.0–100.0)

## 2024-06-06 LAB — LIPID PANEL
Chol/HDL Ratio: 2.9 ratio (ref 0.0–5.0)
Cholesterol, Total: 164 mg/dL (ref 100–199)
HDL: 56 mg/dL (ref >39)
LDL Chol Calc (NIH): 97 mg/dL (ref 0–99)
Triglycerides: 52 mg/dL (ref 0–149)
VLDL Cholesterol Cal: 11 mg/dL (ref 5–40)

## 2024-06-06 LAB — TSH+FREE T4
Free T4: 1.12 ng/dL (ref 0.82–1.77)
TSH: 1.54 u[IU]/mL (ref 0.450–4.500)

## 2024-06-06 LAB — PSA: Prostate Specific Ag, Serum: 2.3 ng/mL (ref 0.0–4.0)

## 2024-06-06 LAB — HEMOGLOBIN A1C
Est. average glucose Bld gHb Est-mCnc: 117 mg/dL
Hgb A1c MFr Bld: 5.7 % — ABNORMAL HIGH (ref 4.8–5.6)

## 2024-06-06 MED ORDER — TRAZODONE HCL 50 MG PO TABS
50.0000 mg | ORAL_TABLET | Freq: Every day | ORAL | 3 refills | Status: AC
  Filled 2024-06-06 – 2024-06-17 (×2): qty 90, 90d supply, fill #0

## 2024-06-06 MED FILL — Atorvastatin Calcium Tab 10 MG (Base Equivalent): 10.0000 mg | ORAL | 90 days supply | Qty: 90 | Fill #0 | Status: AC

## 2024-06-06 NOTE — Progress Notes (Signed)
 Dan,   Kidney and liver look good.  A1C stable with a little improvement from last year but still in pre-diabetes range.  Hemoglobin looks good.  Prostate normal range.  Cholesterol looks GREAT. Stay on lipitor daily.  Vitamin D  looks much better Thyroid  normal.

## 2024-06-11 ENCOUNTER — Other Ambulatory Visit (HOSPITAL_COMMUNITY): Payer: Self-pay

## 2024-06-11 MED ORDER — MOUNJARO 10 MG/0.5ML ~~LOC~~ SOAJ
10.0000 mg | SUBCUTANEOUS | 0 refills | Status: AC
  Filled 2024-06-11: qty 2, 28d supply, fill #0

## 2024-06-17 ENCOUNTER — Encounter (HOSPITAL_COMMUNITY): Payer: Self-pay

## 2024-06-17 ENCOUNTER — Other Ambulatory Visit: Payer: Self-pay | Admitting: Physician Assistant

## 2024-06-17 ENCOUNTER — Other Ambulatory Visit: Payer: Self-pay

## 2024-06-17 ENCOUNTER — Other Ambulatory Visit (HOSPITAL_COMMUNITY): Payer: Self-pay

## 2024-06-18 ENCOUNTER — Other Ambulatory Visit (HOSPITAL_COMMUNITY): Payer: Self-pay

## 2024-06-19 ENCOUNTER — Other Ambulatory Visit (HOSPITAL_COMMUNITY): Payer: Self-pay

## 2024-06-19 MED ORDER — NAPROXEN 500 MG PO TABS
500.0000 mg | ORAL_TABLET | Freq: Every day | ORAL | 0 refills | Status: AC | PRN
  Filled 2024-06-19: qty 90, 90d supply, fill #0

## 2024-06-21 ENCOUNTER — Other Ambulatory Visit (HOSPITAL_COMMUNITY): Payer: Self-pay

## 2024-06-23 ENCOUNTER — Encounter: Payer: Self-pay | Admitting: Physician Assistant

## 2024-06-24 ENCOUNTER — Other Ambulatory Visit (HOSPITAL_COMMUNITY): Payer: Self-pay

## 2024-06-24 MED ORDER — VALACYCLOVIR HCL 1 G PO TABS
1000.0000 mg | ORAL_TABLET | Freq: Every day | ORAL | 3 refills | Status: AC | PRN
  Filled 2024-06-24: qty 90, 90d supply, fill #0
  Filled 2024-06-24 (×2): qty 30, 30d supply, fill #0
  Filled 2024-07-15: qty 30, 30d supply, fill #1

## 2024-06-24 NOTE — Telephone Encounter (Signed)
 Requesting rx rf of Valtrex  1,000mg  Last written 11/17/2023 by Dr. Curtis Last OV 05/22/2024 Upcoming appt 11/20/2024

## 2024-07-15 ENCOUNTER — Other Ambulatory Visit: Payer: Self-pay | Admitting: Physician Assistant

## 2024-07-15 ENCOUNTER — Encounter: Payer: Self-pay | Admitting: Physician Assistant

## 2024-07-15 ENCOUNTER — Other Ambulatory Visit (HOSPITAL_COMMUNITY): Payer: Self-pay

## 2024-07-15 DIAGNOSIS — F900 Attention-deficit hyperactivity disorder, predominantly inattentive type: Secondary | ICD-10-CM

## 2024-07-15 MED ORDER — MOUNJARO 12.5 MG/0.5ML ~~LOC~~ SOAJ
12.5000 mg | SUBCUTANEOUS | 0 refills | Status: AC
  Filled 2024-07-15: qty 2, 28d supply, fill #0

## 2024-07-16 ENCOUNTER — Other Ambulatory Visit: Payer: Self-pay

## 2024-07-16 ENCOUNTER — Other Ambulatory Visit (HOSPITAL_COMMUNITY): Payer: Self-pay

## 2024-07-16 ENCOUNTER — Other Ambulatory Visit: Payer: Self-pay | Admitting: Physician Assistant

## 2024-07-16 MED ORDER — AMPHETAMINE-DEXTROAMPHET ER 10 MG PO CP24: 10.0000 mg | ORAL_CAPSULE | Freq: Every day | ORAL | 0 refills | Status: AC

## 2024-07-16 MED ORDER — AMPHETAMINE-DEXTROAMPHET ER 10 MG PO CP24
10.0000 mg | ORAL_CAPSULE | Freq: Every day | ORAL | 0 refills | Status: AC
  Filled 2024-07-16 – 2024-07-17 (×2): qty 30, 30d supply, fill #0

## 2024-07-17 ENCOUNTER — Telehealth (HOSPITAL_COMMUNITY): Payer: Self-pay

## 2024-07-17 ENCOUNTER — Other Ambulatory Visit (HOSPITAL_COMMUNITY): Payer: Self-pay

## 2024-07-17 NOTE — Telephone Encounter (Signed)
 Pharmacy Patient Advocate Encounter  Received notification from [facility] that Prior Authorization for Amphetamine -Dextroamphet ER 10MG  er capsules  has been APPROVED from 07/17/24 to 07/17/25. Ran test claim, Copay is $10. This test claim was processed through [facility]- copay amounts may vary at other pharmacies due to pharmacy/plan contracts, or as the patient moves through the different stages of their insurance plan.   PA #/Case ID/Reference #: [id number]

## 2024-07-17 NOTE — Telephone Encounter (Signed)
 PA request has been Received. New Encounter has been or will be created for follow up. For additional info see Pharmacy Prior Auth telephone encounter from 07/17/24.

## 2024-11-20 ENCOUNTER — Ambulatory Visit: Admitting: Physician Assistant
